# Patient Record
Sex: Female | Born: 1985 | Race: White | Hispanic: No | Marital: Married | Smoking: Never smoker
Health system: Southern US, Community
[De-identification: ages and names within clinical notes are randomized; demographics above are authoritative.]

## PROBLEM LIST (undated history)

## (undated) DIAGNOSIS — Z856 Personal history of leukemia: Secondary | ICD-10-CM

## (undated) DIAGNOSIS — Z8619 Personal history of other infectious and parasitic diseases: Secondary | ICD-10-CM

## (undated) HISTORY — DX: Personal history of leukemia: Z85.6

## (undated) HISTORY — DX: Personal history of other infectious and parasitic diseases: Z86.19

## (undated) HISTORY — PX: PORT-A-CATH REMOVAL: SHX5289

---

## 2006-05-06 HISTORY — PX: IUD REMOVAL: SHX5392

## 2006-11-18 ENCOUNTER — Inpatient Hospital Stay (HOSPITAL_COMMUNITY): Admission: AD | Admit: 2006-11-18 | Discharge: 2006-11-18 | Payer: Self-pay | Admitting: Obstetrics and Gynecology

## 2006-11-20 ENCOUNTER — Inpatient Hospital Stay (HOSPITAL_COMMUNITY): Admission: AD | Admit: 2006-11-20 | Discharge: 2006-11-20 | Payer: Self-pay | Admitting: Obstetrics and Gynecology

## 2006-11-29 ENCOUNTER — Inpatient Hospital Stay (HOSPITAL_COMMUNITY): Admission: AD | Admit: 2006-11-29 | Discharge: 2006-12-02 | Payer: Self-pay | Admitting: Obstetrics and Gynecology

## 2006-11-30 ENCOUNTER — Encounter (INDEPENDENT_AMBULATORY_CARE_PROVIDER_SITE_OTHER): Payer: Self-pay | Admitting: Obstetrics and Gynecology

## 2007-02-06 ENCOUNTER — Ambulatory Visit (HOSPITAL_COMMUNITY): Admission: RE | Admit: 2007-02-06 | Discharge: 2007-02-06 | Payer: Self-pay | Admitting: Obstetrics and Gynecology

## 2007-02-10 ENCOUNTER — Ambulatory Visit (HOSPITAL_COMMUNITY): Admission: RE | Admit: 2007-02-10 | Discharge: 2007-02-10 | Payer: Self-pay | Admitting: Obstetrics and Gynecology

## 2008-05-03 ENCOUNTER — Inpatient Hospital Stay (HOSPITAL_COMMUNITY): Admission: AD | Admit: 2008-05-03 | Discharge: 2008-05-06 | Payer: Self-pay | Admitting: Obstetrics & Gynecology

## 2010-09-18 NOTE — Discharge Summary (Signed)
NAMENAYELIS, BONITO NO.:  [id number]   MEDICAL RECORD NO.:  [id number]          PATIENT TYPE:  INP   LOCATION:  9113                          FACILITY:  [facility]   PHYSICIAN:  Huel Cote, M.D. DATE OF BIRTH:  July 07, 1985   DATE OF ADMISSION:  05/03/2008  DATE OF DISCHARGE:  05/06/2008                               DISCHARGE SUMMARY   DISCHARGE DIAGNOSES:  1. Term pregnancy at 38 plus weeks delivered.  2. Status post normal spontaneous vaginal delivery.   DISCHARGE MEDICATIONS:  1. Motrin 600 mg p.o. every 6 hours.  2. Percocet 1-2 tablets p.o. every 4 hours p.r.n.   DISCHARGE FOLLOWUP:  The patient is to follow up in the office in 6  weeks for her routine postpartum exam.   HOSPITAL COURSE:  The patient is a 25 year old G2, P1-0-0-0-1, who came  in in early labor on the night of May 03, 2008.  On admission, her  cervix was 4-5 cm and she was actively contracting.  Prenatal care had  been begun late in our office at approximately 24 weeks.  She was group  B strep positive and otherwise was relatively uncomplicated.   PRENATAL LABORATORIES:  As follows, O+, antibody negative, RPR  nonreactive, rubella immune, hepatitis B surface antigen negative, HIV  negative, GC negative, chlamydia negative, 1-hour Glucola 81, group B  strep positive.  She was too late for genetic screening.   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  She also had a past surgical history significant for 1995, she  had a Port-A-Cath insertion and removal in association with her  lymphoblastic anemia and in 2008, she had an IUD, which perforated her  uterus, which was removed and repaired at that time.   PAST OBSTETRICAL HISTORY:  In 2008, she had a vaginal delivery of a 7  pounds infant.   PAST GYNECOLOGICAL HISTORY:  History of HPV with a normal colposcopy.   ALLERGIES:  None.   MEDICATIONS:  Protonix for heartburn.   PHYSICAL  EXAMINATION:  On admission, she was afebrile with stable vital  signs.  As stated, her cervix was 4-5 cm and she was admitted and  managed with rupture of membranes performed after her ampicillin was on  board for group B strep status.  She progressed well and received an  epidural and reached complete dilation and delivered a living female  infant 7 pounds 5 ounces.  Apgars were 8 and 9.  Infant was slowly  delivered intact.  Uterus was normal.  Left labial laceration and right  labial skidmarks were hemostatic and did not require repair.  She was  therefore admitted for routine postpartum care.  On postpartum day #1,  she was doing well and pain was well controlled.  Her  hemoglobin was 10.5 down from 12.2.  Postpartum day #2, she continued to  do well.  She remained afebrile and her vaginal bleeding was normal,  therefore, she was felt stable for discharge home and was discharged on  Motrin and Percocet to follow up in the office in 6  weeks.      Huel Cote, M.D.  Electronically Signed     KR/MEDQ  D:  05/06/2008  T:  05/06/2008  Job:  [id number]

## 2010-09-18 NOTE — Op Note (Signed)
Rita Adkins, Rita Adkins NO.:  [id number]   MEDICAL RECORD NO.:  [id number]          PATIENT TYPE:  INP   LOCATION:  9101                          FACILITY:  [facility]   PHYSICIAN:  Randye Lobo, M.D.   DATE OF BIRTH:  1985/12/29   DATE OF PROCEDURE:  11/30/2006  DATE OF DISCHARGE:                               OPERATIVE REPORT   PREOPERATIVE DIAGNOSIS:  1. Intrauterine pregnancy at 38 + 6 weeks.  2. Moderate to severe fetal variable decelerations.  3. Positive group B strep status.   POSTOPERATIVE DIAGNOSIS:  1. Intrauterine gestation at 73 + 6 weeks.  2. Moderate to severe fetal variable decelerations.  3. Group B strep positive status.   PROCEDURE:  Vacuum-assisted vaginal delivery with midline episiotomy and  repair.   SURGEON:  Conley Simmonds, M.D.   ANESTHESIA:  Epidural, local with 1% lidocaine.   ESTIMATED BLOOD LOSS:  450 mL   COMPLICATIONS:  None.   INDICATIONS FOR PROCEDURE:  The patient is a 25 year old gravida 1, para  104 Caucasian female admitted on November 29, 2006 at 38 plus [redacted] weeks  gestation with contractions since 1600 that day.  The patient had had an  uneventful antepartum course and had a known positive group B strep  status.  I was called urgently from the maternity admissions area  regarding a 7-minute deceleration of the fetal heart rate which had  occurred shortly after the patient was on the monitor.  At the time I  was called the fetal heart rate deceleration had already recovered.  I  immediately proceeded to the [facility].  The patient did have an  IV and oxygen per face mask and the fetal heart rate had recovered with  10 x 10 accelerations of the heart rate.  The patient's cervical  examination was noted to be 5 cm when the deceleration occurred.  The  patient was escorted over to the labor and delivery suite.  After  arrival there and again confirmation of the reassuring fetal heart rate  tracing the membranes were then  ruptured and clear fluid was noted.  The  patient's cervix was 4-5 cm dilated, 90% effaced and with the vertex at  the -1 station at this time.  The fetal scalp electrode was placed.   The patient progressed throughout her active stage of labor with a  reactive fetal heart tracing.  She had a normal labor curve.  When she  had achieved complete dilation and began pushing, there were moderate to  deep variable decelerations of the fetal heart rate.  A recommendation  was therefore made at this time to proceed with a vacuum-assisted  vaginal delivery along with a possible midline episiotomy.  The patient  agreed.   FINDINGS:  A viable female was delivered and 6:09 a.m. with Apgars of 9  at 1 and 9 at 5 minutes.  No nuchal cord was appreciated but the  umbilical cord was noted to be short.  The newborn was vigorous after  delivery.  The placenta had a three-vessel cord.  There was a false knot  in the cord.  There was an absence of spiraling appreciated.   SPECIMEN:  The placenta was sent to pathology.   PROCEDURE:  The patient's Foley catheter had been previously removed.  The patient was examined and noted to have the fetal vertex at the +3  station in the left occiput anterior position.  The Mityvac was applied  to the fetal vertex and used over two contractions, but there was a poor  seal due to the fetal scalp electrode cord.  There were no pop offs  during this time.  The midline episiotomy was therefore performed after  local 1% lidocaine had been administered.  The Mityvac was used again  one final time with appropriate pressure and a viable female was  delivered.  The cord was doubly clamped and cut and the newborn was  noted to be vigorous and the infant was placed on the maternal abdomen  and chest.   A repair of the midline episiotomy was performed at this time with 2-0  Vicryl.  A small hymenal laceration was also repaired with a single  interrupted suture of 2-0 Vicryl.   The placenta was then spontaneously  delivered and was set aside for cord blood collection and donation.  It  was then sent to pathology.   The patient began to have moderate vaginal bleeding.  A manual  exploration was performed and there was no evidence of any products of  conception appreciated.  The patient did receive Pitocin 20 units IV and  also Methergine 0.2 mg IM.  The patient then had good uterine tone.   The cervix and vagina were noted to be intact.   This concluded the patient's procedure.  There were no complications.  All needle, instrument, sponge counts were correct.      Randye Lobo, M.D.  Electronically Signed     BES/MEDQ  D:  11/30/2006  T:  11/30/2006  Job:  [id number]

## 2010-09-18 NOTE — Op Note (Signed)
NAMEJOHNASIA, LIESE NO.:  [id number]   MEDICAL RECORD NO.:  [id number]          PATIENT TYPE:  AMB   LOCATION:  SDC                           [facility]:  [facility]   PHYSICIAN:  Randye Lobo, M.D.   DATE OF BIRTH:  09/17/1985   DATE OF PROCEDURE:  02/10/2007  DATE OF DISCHARGE:                               OPERATIVE REPORT   PREOPERATIVE DIAGNOSIS:  Intraperitoneal IUD.   POSTOPERATIVE DIAGNOSIS:  Intraperitoneal IUD.   PROCEDURE:  Laparoscopic retrieval of Merina IUD.   SURGEON:  Randye Lobo, M.D.   ASSISTANT:  Freddrick March. Tenny Craw, M.D.   ANESTHESIA:  General endotracheal, local with 0.25% Marcaine.   INTRAVENOUS FLUIDS:  1200 mL Ringer's lactate.   ESTIMATED BLOOD LOSS:  Minimal.   URINE OUTPUT:  75 mL.   COMPLICATIONS:  None.   INDICATIONS FOR PROCEDURE:  The patient is a 25 year old gravida 1, para  1, Caucasian female, status post normal spontaneous vaginal delivery of  a viable female on December 02, 2006, who had placement of a Merina IUD on  January 26, 2007, who presented for her routine annual exam with me  and, at that time, was noted to have no evidence of IUD strings coming  from the cervix into the vagina.  The patient had been reporting some  vaginal spotting and some very mild abdominal discomfort.  A  transvaginal ultrasound was performed which documented the absence of an  IUD in the endometrial canal.  An abdominal pelvic x-ray demonstrated an  IUD in the mid pelvis to the left of the midline.  The patient was given  a diagnosis of a migrated IUD and a plan was made to proceed with  laparoscopy and removal of the IUD after risks, benefits, and  alternatives were reviewed.   FINDINGS:  Examination under anesthesia revealed a small anteverted  mobile uterus.  No adnexal masses were appreciated.  At the time of  laparoscopy, there was evidence of what was a probable perforation of  the posterior uterine fundus in the midline just to the  right of the  midline.  The site was not bleeding.  There was evidence of a small  amount of bloody fluid in the posterior cul-de-sac.  The fallopian tubes  and ovaries appeared to be unremarkable.  The liver, gallbladder and  stomach organs were unremarkable.  The IUD was noted in the upper  abdomen and was adherent to omentum that did not contain any bowel.   SPECIMENS:  The IUD was removed in its entirely and it was disposed of.   OPERATIVE PROCEDURE:  The patient was reidentified in the preoperative  hold area.  The patient was noted to have a white blood cell count of  17,000.  The patient did receive Ancef 1 gram IV for antibiotic  prophylaxis.  In the operating room, general endotracheal anesthesia was  induced and the patient was placed in the dorsal lithotomy position.  The abdomen and vagina were sterilely prepped and draped.  The bladder  was catheterized of urine.   The laparoscopy procedure began with  a 1 cm umbilical incision which was  created sharply with a scalpel.  The incision was carried down to the  fascia using an Allis clamp.  A 10 mm trocar was inserted directly into  the peritoneal cavity without difficulty and the laparoscope confirmed  proper placement.  A pneumoperitoneum was achieved with CO2 gas and the  patient was then placed in the Trendelenburg position.  A 5 mm left and  right lower quadrant incisions were created sharply with a scalpel and 5  mm trocars were each placed under direct visualization of the  laparoscope.  A systematic inspection of the pelvis and abdomen were  performed.  The exam began first in the pelvis.  The Nezhat was used to  suction the bloody peritoneal fluid which was noted in the posterior cul-  de-sac.  The IUD was not visualized in the pelvis.  The exploration then  continued into the upper abdomen at which time the [facility] IUD was noted  to be entwined within a portion of omentum.  The IUD was easily released  from the  omentum and was removed in entirety through the left lower  quadrant trocar.  The omentum was then evaluated and there was a small  amount of oozing which was appreciated.  The omentum was completely  inspected to assure that no bowel was in this location before the  Kleppinger forceps was used to create hemostasis.   The procedure was concluded at this time.  The lower abdominal trocars  were removed under direct visualization of the laparoscope.  The CO2  pneumoperitoneum was released and the 10 mm laparoscope and the 10 mm  umbilical trocar were removed simultaneously.  All skin incisions were  closed with subcuticular sutures of 3-0 plain gut suture and Steri-  Strips were placed over the incisions after they were injected with  0.25% Marcaine.  The Hulka tenaculum was removed from the uterus.  Hemostasis was noted to be good.  This concluded the patient's  procedure.  There were no complications.  All needle, instrument, and  sponge counts were correct.  The patient is escorted to the recovery  room in stable and awake condition.      Randye Lobo, M.D.  Electronically Signed     BES/MEDQ  D:  02/10/2007  T:  02/10/2007  Job:  [id number]

## 2011-02-08 LAB — CBC
HCT: 31.6 % — ABNORMAL LOW (ref 36.0–46.0)
Hemoglobin: 10.5 g/dL — ABNORMAL LOW (ref 12.0–15.0)
Hemoglobin: 12.2 g/dL (ref 12.0–15.0)
Platelets: 255 10*3/uL (ref 150–400)
RDW: 14.6 % (ref 11.5–15.5)
WBC: 10.1 10*3/uL (ref 4.0–10.5)

## 2011-02-14 LAB — CBC
HCT: 43.5
Hemoglobin: 14.8
MCHC: 34.1
MCV: 87.1
RDW: 12.7
WBC: 5.1

## 2011-02-18 LAB — CBC
MCHC: 34.2
MCHC: 34.3
MCV: 89.1
Platelets: 262
RBC: 3.9
RDW: 13.1
RDW: 13.3

## 2011-02-18 LAB — URINALYSIS, ROUTINE W REFLEX MICROSCOPIC
Bilirubin Urine: NEGATIVE
Glucose, UA: NEGATIVE
Hgb urine dipstick: NEGATIVE
Ketones, ur: 40 — AB
Nitrite: NEGATIVE
Urobilinogen, UA: 1
pH: 6.5

## 2011-02-18 LAB — RPR: RPR Ser Ql: NONREACTIVE

## 2011-02-18 LAB — RAPID HIV SCREEN (WH-MAU): Rapid HIV Screen: NONREACTIVE

## 2011-02-18 LAB — URINE MICROSCOPIC-ADD ON: RBC / HPF: NONE SEEN

## 2012-09-15 ENCOUNTER — Ambulatory Visit (INDEPENDENT_AMBULATORY_CARE_PROVIDER_SITE_OTHER): Payer: BC Managed Care – PPO | Admitting: Family

## 2012-09-15 ENCOUNTER — Encounter: Payer: Self-pay | Admitting: Family

## 2012-09-15 VITALS — BP 118/80 | HR 85 | Temp 98.4°F | Resp 16 | Ht 65.5 in | Wt 155.1 lb

## 2012-09-15 DIAGNOSIS — C9101 Acute lymphoblastic leukemia, in remission: Secondary | ICD-10-CM | POA: Insufficient documentation

## 2012-09-15 DIAGNOSIS — R42 Dizziness and giddiness: Secondary | ICD-10-CM

## 2012-09-15 DIAGNOSIS — Z8619 Personal history of other infectious and parasitic diseases: Secondary | ICD-10-CM

## 2012-09-15 DIAGNOSIS — G43909 Migraine, unspecified, not intractable, without status migrainosus: Secondary | ICD-10-CM

## 2012-09-15 LAB — CBC WITH DIFFERENTIAL/PLATELET
Eosinophils Absolute: 0.1 10*3/uL (ref 0.0–0.7)
Lymphocytes Relative: 31 % (ref 12–46)
MCHC: 34.4 g/dL (ref 30.0–36.0)
Neutrophils Relative %: 62 % (ref 43–77)
RDW: 13.2 % (ref 11.5–15.5)
WBC: 7.4 10*3/uL (ref 4.0–10.5)

## 2012-09-15 MED ORDER — SUMATRIPTAN SUCCINATE 50 MG PO TABS: ORAL_TABLET | ORAL | Status: DC

## 2012-09-15 MED ORDER — METOPROLOL TARTRATE 25 MG PO TABS: 12.5000 mg | ORAL_TABLET | Freq: Two times a day (BID) | ORAL | Status: DC

## 2012-09-15 NOTE — Patient Instructions (Addendum)
Please complete lab work prior to leaving. You will be contacted about your referral to Hematology.  Please let us know if you have not heard back within 1 week about your referral. Please follow up in 1 month for a fasting physical. Welcome to [facility]!

## 2012-09-15 NOTE — Progress Notes (Signed)
Subjective:    Patient ID: Rita Adkins, female    DOB: August 07, 1985, 27 y.o.   MRN: [id number]  HPI  Rita Adkins is a 27 yr old female who presents today to establish care.   HA's- reports HA's 5x a week.  Associated with nausea.  Uses otc excedrin migraine which helps if she catches early enough. She reports pain can vary in location on her head.  Describes pain as throbbing.  Occasional phonophobia/phonophobia.  Reports + family hx of "really bad headaches." Has had HA's all her life.    ALL- diagnosed in 34 (age 60).  Went through 2.5 yrs of treatment.  She completed chemotherapy.  Treatments were received at [facility].   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  She had dizziness that whole day. Denies LOC, but "things got black." Denies bladder loss.    HPV- by pap smear 2005.  This was followed by biopsy and reports clear pap after that.  Last pap smear was in 2010.    Review of Systems  Constitutional: Negative for unexpected weight change.  HENT: Negative for hearing loss and congestion.   Eyes: Negative for visual disturbance.  Respiratory: Negative for cough and shortness of breath.   Cardiovascular: Negative for chest pain.  Gastrointestinal: Negative for vomiting, diarrhea and constipation.       Reports that she has sensitive stomach  Genitourinary: Negative for dysuria.  Musculoskeletal: Negative for myalgias.       Reports joint pain, ? Related to chemo. Knees shoulders, ankles  Skin: Negative for rash.  Neurological: Positive for dizziness and headaches.  Hematological: Negative for adenopathy.  Psychiatric/Behavioral:       Denies depression/anxiety   Past Medical History  Diagnosis Date  . 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 History of HPV infection     History   Social History  . Marital Status: Married    Spouse Name: N/A    Number of Children: 2  . Years of Education: N/A   Occupational History  .     Social History Main Topics  . Smoking  status: Never Smoker   . Smokeless tobacco: Never Used  . Alcohol Use: 1.0 oz/week    2 drink(s) per week  . Drug Use: Not on file  . Sexually Active: Not on file   Other Topics Concern  . Not on file   Social History Narrative   Married   Has a 4 yr old daughter and 90 yr old son   Works as Child psychotherapist   Enjoys Aeronautical engineer for Manufacturing systems engineer    Past Surgical History  Procedure Laterality Date  . Port-a-cath removal  1997 or 1999?  Marland Kitchen Iud removal  2008    "IUD penetrated the abdomen and had to be surgically removed"    Family History  Problem Relation Age of Onset  . COPD Mother   . Hypertension Father   . Hyperlipidemia Father   . Cancer Paternal Uncle     lung  . Diabetes Maternal Grandmother   . Cancer Paternal Grandfather     brain  . Heart disease Neg Hx   . Stroke Neg Hx     No Known Allergies  No current outpatient prescriptions on file prior to visit.   No current facility-administered medications on file prior to visit.    BP 118/80  Pulse 85  Temp(Src) 98.4 F (36.9 C) (Oral)  Resp 16  Ht 5' 5.5" (1.664 m)  Wt 155 lb 1.9 oz (70.362 kg)  BMI 25.41 kg/m2  SpO2 99%  LMP 09/12/2012       Objective:   Physical Exam  Constitutional: She is oriented to person, place, and time. She appears well-developed and well-nourished. No distress.  HENT:  Head: Normocephalic and atraumatic.  Cardiovascular: Normal rate and regular rhythm.   No murmur heard. Pulmonary/Chest: Effort normal and breath sounds normal. No respiratory distress. She has no wheezes. She has no rales. She exhibits no tenderness.  Lymphadenopathy:    She has no cervical adenopathy.  Neurological: She is alert and oriented to person, place, and time.  Skin: Skin is warm and dry.  Psychiatric: She has a normal mood and affect. Her behavior is normal. Judgment and thought content normal.  neuro: bilateral UE/LE strength 5/5         Assessment & Plan:

## 2012-09-15 NOTE — Assessment & Plan Note (Signed)
Obtain cbc, refer to hematology.

## 2012-09-15 NOTE — Assessment & Plan Note (Signed)
Trial of low dose beta blocker.  Add imitrex prn, recommended sparing use of excedrin migraine.

## 2012-09-16 ENCOUNTER — Encounter: Payer: Self-pay | Admitting: Family

## 2012-09-16 DIAGNOSIS — R42 Dizziness and giddiness: Secondary | ICD-10-CM | POA: Insufficient documentation

## 2012-09-16 DIAGNOSIS — Z8619 Personal history of other infectious and parasitic diseases: Secondary | ICD-10-CM | POA: Insufficient documentation

## 2012-09-16 NOTE — Assessment & Plan Note (Signed)
?   Related to migraine, ? Vertigo. Will see how she does with addition of beta blocker for migraine prophylaxis.

## 2012-09-16 NOTE — Assessment & Plan Note (Signed)
Plan pap next visit.

## 2012-09-17 ENCOUNTER — Telehealth: Payer: Self-pay | Admitting: Hematology & Oncology

## 2012-09-17 NOTE — Telephone Encounter (Signed)
Left pt message to call and schedule appointment °

## 2012-09-17 NOTE — Telephone Encounter (Signed)
Pt aware of 6-12 appointment could only come on tues or Thursday mornings

## 2012-10-14 ENCOUNTER — Ambulatory Visit: Payer: BC Managed Care – PPO

## 2012-10-14 ENCOUNTER — Ambulatory Visit (HOSPITAL_BASED_OUTPATIENT_CLINIC_OR_DEPARTMENT_OTHER): Payer: BC Managed Care – PPO | Admitting: Hematology & Oncology

## 2012-10-14 ENCOUNTER — Other Ambulatory Visit (HOSPITAL_BASED_OUTPATIENT_CLINIC_OR_DEPARTMENT_OTHER): Payer: BC Managed Care – PPO | Admitting: Lab

## 2012-10-14 VITALS — BP 103/62 | HR 88 | Temp 98.0°F | Resp 16 | Ht 65.0 in | Wt 155.0 lb

## 2012-10-14 DIAGNOSIS — C9101 Acute lymphoblastic leukemia, in remission: Secondary | ICD-10-CM

## 2012-10-14 DIAGNOSIS — T38805A Adverse effect of unspecified hormones and synthetic substitutes, initial encounter: Secondary | ICD-10-CM

## 2012-10-14 DIAGNOSIS — T50905S Adverse effect of unspecified drugs, medicaments and biological substances, sequela: Secondary | ICD-10-CM

## 2012-10-14 DIAGNOSIS — T451X5S Adverse effect of antineoplastic and immunosuppressive drugs, sequela: Secondary | ICD-10-CM

## 2012-10-14 LAB — COMPREHENSIVE METABOLIC PANEL
ALT: 10 U/L (ref 0–35)
AST: 16 U/L (ref 0–37)
Albumin: 4.6 g/dL (ref 3.5–5.2)
Alkaline Phosphatase: 57 U/L (ref 39–117)
BUN: 15 mg/dL (ref 6–23)
CO2: 27 mEq/L (ref 19–32)
Creatinine, Ser: 0.82 mg/dL (ref 0.50–1.10)
Glucose, Bld: 101 mg/dL — ABNORMAL HIGH (ref 70–99)
Total Protein: 6.5 g/dL (ref 6.0–8.3)

## 2012-10-14 LAB — CBC WITH DIFFERENTIAL (CANCER CENTER ONLY)
BASO#: 0 10*3/uL (ref 0.0–0.2)
BASO%: 0.1 % (ref 0.0–2.0)
EOS%: 1.1 % (ref 0.0–7.0)
MCV: 88 fL (ref 81–101)
MONO#: 0.5 10*3/uL (ref 0.1–0.9)
MONO%: 6.7 % (ref 0.0–13.0)
NEUT#: 3.9 10*3/uL (ref 1.5–6.5)
Platelets: 250 10*3/uL (ref 145–400)
RBC: 4.41 10*6/uL (ref 3.70–5.32)
RDW: 12.1 % (ref 11.1–15.7)
WBC: 7.2 10*3/uL (ref 3.9–10.0)

## 2012-10-14 LAB — CHCC SATELLITE - SMEAR

## 2012-10-14 NOTE — Progress Notes (Signed)
This office note has been dictated.

## 2012-10-15 ENCOUNTER — Ambulatory Visit: Payer: BC Managed Care – PPO

## 2012-10-15 ENCOUNTER — Telehealth: Payer: Self-pay | Admitting: Hematology & Oncology

## 2012-10-15 ENCOUNTER — Ambulatory Visit: Payer: BC Managed Care – PPO | Admitting: Hematology & Oncology

## 2012-10-15 ENCOUNTER — Other Ambulatory Visit: Payer: BC Managed Care – PPO | Admitting: Lab

## 2012-10-15 NOTE — Telephone Encounter (Signed)
Left pt message to call need to schedule appointment for around 6-26. I asked her to leave a message of day and time if I don't answer.

## 2012-10-15 NOTE — Progress Notes (Signed)
CC:   Sandford Craze, NP  DIAGNOSIS:  History of childhood ALL.  HISTORY OF PRESENT ILLNESS:  Rita Adkins is a very charming 27 year old white female.  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  She had 2-1/2 years of therapy.  She obviously has been cured.  However, she does not have any kind of oncology followup to make sure that there is no long-term delayed consequences of her chemotherapy.  She does have 2 kids.  Getting pregnant for her was no problem.  Her main problem now is a lot of arthralgias.  This certainly could be from the chemotherapy.  It appears that she actually does work two jobs. The patient has had no cough.  There has been no shortness of breath.  She has had no bleeding or bruising.  Again, she has regular monthly cycles.  She has had no rashes.  There has been no double vision or blurred vision.  There is no dysphagia or odynophagia.  Overall, her performance status is ECOG 0.  PAST MEDICAL HISTORY:  Remarkable: 1. ALL in clinical remission. 2. Migraines.  ALLERGIES:  None.  MEDICATIONS:  Metoprolol 12.5 mg p.o. b.i.d., Imitrex 50 mg as needed for migraine.  SOCIAL HISTORY:  Negative for tobacco or alcohol use.  There is no obvious occupational exposures.  FAMILY HISTORY:  Noncontributory.  REVIEW OF SYSTEMS:  As stated in history present illness.  Again, she does have the arthralgias which do bother her.  PHYSICAL EXAMINATION:  General:  This is a well-developed, well- nourished white female in no obvious distress.  Vital signs: Temperature of 98, pulse 88, respiratory rate 16, blood pressure 103/62. Weight is 155.  Head and neck:  Normocephalic, atraumatic skull.  There are no ocular or oral lesions.  There are no palpable cervical or supraclavicular lymph nodes.  Lungs:  Clear to percussion and auscultation bilaterally.  Cardiac:  Regular rate and rhythm with a normal S1 and S2.  There are no  murmurs, rubs or bruits.  Abdomen:  Soft with good bowel sounds.  There is no palpable abdominal mass.  There is no fluid wave.  There is no palpable hepatosplenomegaly.  Back:  No tenderness over the spine, ribs, or hips.  Extremities:  Show no clubbing, cyanosis or edema.  Skin:  No rashes, ecchymoses or petechia.  LABORATORY STUDIES:  White cell count 7.2, hemoglobin 13.2, hematocrit 38.9, platelet count 250.  Peripheral smear shows a normochromic, normocytic population of red blood cells.  There are no nucleated red blood cells.  There are no teardrop cells.  I see no target cells.  There is no rouleaux formation. White cells appear normal in morphology and maturation.  There are no atypical lymphocytes.  I see no blasts.  There are no immature myeloid cells.  There are no hypersegmented polys.  Platelets are adequate in number and size.  IMPRESSION:  Mr. Devora is a very charming 27 year old white female with a history of ALL. She was treated with standard chemotherapy.  She is cured from my point of view.  I think that the long-term complications that might be watched out for her would be with her bones.  She probably has some risk of osteoporosis.  I think a bone density test would be helpful and important for her.  I also think that she needs vitamin D.  I suspect that some of the arthralgias might be from vitamin D deficiency.  I will have her  take 2000 units a day of vitamin D.  I also think a chest x-ray would be helpful.  I think she is at risk for a second malignancy from her chemotherapy.  Given that she does not smoke, there is still an increased risk.  As such, a chest x-ray would be helpful.  Apparently, she does have a gynecologist so any kind of "female issue" is being addressed.  I do not think I would need to see Ms. Hanssen but yearly.  Again, I think that she is cured of her ALL. I could not imagine her having any kind of relapse after 14-15 years.  Again,  she would be at risk for a second malignancy.  I suppose that she could have a second type of bone marrow malignancy which we need to watch out for.  I do not believe that she needs a mammogram until she is 27 years old. She probably needs to have her cholesterol watched.  I am sure that Sandford Craze, NP will be able to handle that.  I had a really nice talk with Ms. Odor. I gave her a prayer blanket. She was very appreciative of this.    ______________________________ Josph Macho, M.D. PRE/MEDQ  D:  10/14/2012  T:  10/15/2012  Job:  [id number]

## 2012-10-16 ENCOUNTER — Encounter: Payer: BC Managed Care – PPO | Admitting: Family

## 2012-10-21 ENCOUNTER — Telehealth: Payer: Self-pay | Admitting: Hematology & Oncology

## 2012-10-21 NOTE — Telephone Encounter (Signed)
Pt had called wanting her appointments on mon or fri morning and to leave message on cell phone. I scheduled 10-30-12 CXR and bone density at [facility] for 915 am with Quel. I left message on pt's voice mail with [facility] call back number if she needs to change it.. And also changed the time of her 10-2013 appointment

## 2012-10-30 ENCOUNTER — Other Ambulatory Visit (HOSPITAL_COMMUNITY)
Admission: RE | Admit: 2012-10-30 | Discharge: 2012-10-30 | Disposition: A | Discharge status: Home / Self Care | Payer: BC Managed Care – PPO | Source: Ambulatory Visit | Attending: Family | Admitting: Family

## 2012-10-30 ENCOUNTER — Ambulatory Visit (INDEPENDENT_AMBULATORY_CARE_PROVIDER_SITE_OTHER): Payer: BC Managed Care – PPO | Admitting: Family

## 2012-10-30 ENCOUNTER — Ambulatory Visit (HOSPITAL_COMMUNITY)
Admission: RE | Admit: 2012-10-30 | Discharge: 2012-10-30 | Disposition: A | Discharge status: Home / Self Care | Payer: BC Managed Care – PPO | Source: Ambulatory Visit | Attending: Hematology & Oncology | Admitting: Hematology & Oncology

## 2012-10-30 ENCOUNTER — Ambulatory Visit (HOSPITAL_COMMUNITY): Payer: BC Managed Care – PPO

## 2012-10-30 ENCOUNTER — Encounter: Payer: Self-pay | Admitting: Family

## 2012-10-30 ENCOUNTER — Other Ambulatory Visit (HOSPITAL_COMMUNITY): Payer: BC Managed Care – PPO

## 2012-10-30 VITALS — BP 100/80 | HR 77 | Temp 97.9°F | Resp 16 | Ht 65.5 in | Wt 153.0 lb

## 2012-10-30 DIAGNOSIS — Z0000 Encounter for general adult medical examination without abnormal findings: Secondary | ICD-10-CM | POA: Insufficient documentation

## 2012-10-30 DIAGNOSIS — T451X5S Adverse effect of antineoplastic and immunosuppressive drugs, sequela: Secondary | ICD-10-CM

## 2012-10-30 DIAGNOSIS — Z01419 Encounter for gynecological examination (general) (routine) without abnormal findings: Secondary | ICD-10-CM

## 2012-10-30 DIAGNOSIS — Z23 Encounter for immunization: Secondary | ICD-10-CM

## 2012-10-30 DIAGNOSIS — G43909 Migraine, unspecified, not intractable, without status migrainosus: Secondary | ICD-10-CM

## 2012-10-30 DIAGNOSIS — Z309 Encounter for contraceptive management, unspecified: Secondary | ICD-10-CM

## 2012-10-30 DIAGNOSIS — T50905S Adverse effect of unspecified drugs, medicaments and biological substances, sequela: Secondary | ICD-10-CM | POA: Insufficient documentation

## 2012-10-30 DIAGNOSIS — Z13820 Encounter for screening for osteoporosis: Secondary | ICD-10-CM | POA: Insufficient documentation

## 2012-10-30 DIAGNOSIS — Z856 Personal history of leukemia: Secondary | ICD-10-CM | POA: Insufficient documentation

## 2012-10-30 LAB — URINALYSIS, ROUTINE W REFLEX MICROSCOPIC
Bilirubin Urine: NEGATIVE
Glucose, UA: NEGATIVE mg/dL
Hgb urine dipstick: NEGATIVE
Leukocytes, UA: NEGATIVE
Nitrite: NEGATIVE
Specific Gravity, Urine: 1.011 (ref 1.005–1.030)
Urobilinogen, UA: 0.2 mg/dL (ref 0.0–1.0)
pH: 8 (ref 5.0–8.0)

## 2012-10-30 MED ORDER — LEVONORGEST-ETH ESTRAD 91-DAY 0.15-0.03 &0.01 MG PO TABS: 1.0000 | ORAL_TABLET | Freq: Every day | ORAL | Status: DC

## 2012-10-30 NOTE — Addendum Note (Signed)
Addended by: Mervin Kung A on: 10/30/2012 11:05 AM   Modules accepted: Orders

## 2012-10-30 NOTE — Assessment & Plan Note (Signed)
Continue healthy diet, add regular exercise.  Tdap today. Requests rx for OCP. Obtain urine pregnancy test.

## 2012-10-30 NOTE — Progress Notes (Signed)
Subjective:    Patient ID: Rita Adkins, female    DOB: 06-18-1985, 27 y.o.   MRN: [id number]  HPI  Patient presents today for complete physical.  Immunizations: unsure of last tetanus Diet: reports healthy diet Exercise: Not exercising due to working 2 jobs. Pap Smear: 2010- repors hx of abnormal pap in 2007 (HPV- had biopsy) Follow up pap normal.    Review of Systems  Constitutional: Negative for unexpected weight change.  HENT: Negative for hearing loss.        Reports getting over a cold last week  Eyes: Negative for visual disturbance.  Respiratory: Negative for shortness of breath.   Cardiovascular: Negative for chest pain.  Gastrointestinal: Negative for nausea, vomiting and diarrhea.  Genitourinary: Negative for dysuria and menstrual problem.  Musculoskeletal: Negative for myalgias and arthralgias.  Skin: Negative for rash.  Neurological:       Headaches are improved since starting metoprolol  Hematological: Negative for adenopathy.  Psychiatric/Behavioral:       Denies anxiety depression   Past Medical History  Diagnosis Date  . 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 History of HPV infection     History   Social History  . Marital Status: Married    Spouse Name: N/A    Number of Children: 2  . Years of Education: N/A   Occupational History  .     Social History Main Topics  . Smoking status: Never Smoker   . Smokeless tobacco: Never Used  . Alcohol Use: 1.0 oz/week    2 drink(s) per week  . Drug Use: Not on file  . Sexually Active: Not on file   Other Topics Concern  . Not on file   Social History Narrative   Married   Has a 29 yr old daughter and 68 yr old son   Works as Child psychotherapist   Enjoys Aeronautical engineer for Manufacturing systems engineer    Past Surgical History  Procedure Laterality Date  . Port-a-cath removal  1997 or 1999?  Marland Kitchen Iud removal  2008    "IUD penetrated the abdomen and had to be surgically  removed"    Family History  Problem Relation Age of Onset  . COPD Mother   . Hypertension Father   . Hyperlipidemia Father   . Cancer Paternal Uncle     lung  . Diabetes Maternal Grandmother   . Cancer Paternal Grandfather     brain  . Heart disease Neg Hx   . Stroke Neg Hx     No Known Allergies  Current Outpatient Prescriptions on File Prior to Visit  Medication Sig Dispense Refill  . aspirin-acetaminophen-caffeine (EXCEDRIN MIGRAINE) 250-250-65 MG per tablet Takes 1 tablet every 2 days as needed for headache.      . metoprolol tartrate (LOPRESSOR) 25 MG tablet Take 0.5 tablets (12.5 mg total) by mouth 2 (two) times daily.  30 tablet  1  . SUMAtriptan (IMITREX) 50 MG tablet One tab at start of migraine.  May repeat in 2 hrs as needed.  Max 2 tabs/24 hrs.  10 tablet  2   No current facility-administered medications on file prior to visit.    BP 100/80  Pulse 77  Temp(Src) 97.9 F (36.6 C) (Oral)  Resp 16  Ht 5' 5.5" (1.664 m)  Wt 153 lb (69.4 kg)  BMI 25.06 kg/m2  SpO2 99%  LMP 10/09/2012  Objective:   Physical Exam  Physical Exam  Constitutional: She is oriented to person, place, and time. She appears well-developed and well-nourished. No distress.  HENT:  Head: Normocephalic and atraumatic.  Right Ear: Tympanic membrane and ear canal normal.  Left Ear: Tympanic membrane and ear canal normal.  Mouth/Throat: Oropharynx is clear and moist.  Eyes: Pupils are equal, round, and reactive to light. No scleral icterus.  Neck: Normal range of motion. No thyromegaly present.  Cardiovascular: Normal rate and regular rhythm.   No murmur heard. Pulmonary/Chest: Effort normal and breath sounds normal. No respiratory distress. He has no wheezes. She has no rales. She exhibits no tenderness.  Abdominal: Soft. Bowel sounds are normal. He exhibits no distension and no mass. There is no tenderness. There is no rebound and no guarding.  Musculoskeletal: She exhibits no  edema.  Lymphadenopathy:    She has no cervical adenopathy.  Neurological: She is alert and oriented to person, place, and time. She has normal reflexes. She exhibits normal muscle tone. Coordination normal.  Skin: Skin is warm and dry.  Psychiatric: She has a normal mood and affect. Her behavior is normal. Judgment and thought content normal.  Breasts: Examined lying Right: Without masses, retractions, discharge or axillary adenopathy.  Left: Without masses, retractions, discharge or axillary adenopathy.  Inguinal/mons: Normal without inguinal adenopathy  External genitalia: Normal  BUS/Urethra/Skene's glands: Normal  Bladder: Normal  Vagina: Normal  Cervix: Normal  Uterus: normal in size, shape and contour. Midline and mobile  Adnexa/parametria:  Rt: Without masses or tenderness.  Lt: Without masses or tenderness.  Anus and perineum: Normal           Assessment & Plan:        Assessment & Plan:  Pap performed today with chaperone

## 2012-10-30 NOTE — Patient Instructions (Addendum)
Please complete lab work prior to leaving. Follow up in 6 months, sooner if problems/concerns.  

## 2012-10-30 NOTE — Assessment & Plan Note (Signed)
Improved on metoprolol, bp and HR stable, continue same.

## 2012-10-30 NOTE — Addendum Note (Signed)
Addended by: Mervin Kung A on: 10/30/2012 11:09 AM   Modules accepted: Orders

## 2012-10-31 LAB — LIPID PANEL
LDL Cholesterol: 91 mg/dL (ref 0–99)
Total CHOL/HDL Ratio: 3.9 Ratio

## 2012-11-02 ENCOUNTER — Encounter: Payer: Self-pay | Admitting: Family

## 2012-11-03 ENCOUNTER — Telehealth: Payer: Self-pay | Admitting: *Deleted

## 2012-11-03 NOTE — Telephone Encounter (Signed)
Message copied by Anselm Jungling on Tue Nov 03, 2012 11:25 AM ------      Message from: Josph Macho      Created: Sat Oct 31, 2012 12:18 PM       Call - cxr is ok!!  Cindee Lame ------

## 2012-11-03 NOTE — Telephone Encounter (Signed)
Called patient and left message on personal cell phone to let her know that her chest x ray was good per d.r ennever

## 2012-11-16 ENCOUNTER — Other Ambulatory Visit: Payer: Self-pay | Admitting: *Deleted

## 2012-11-16 NOTE — Telephone Encounter (Signed)
Received fax from pharmacy reporting PA needed for Sumitriptan via [employer] @ [phone]/SLS Will call to initiate.

## 2012-11-27 NOTE — Telephone Encounter (Signed)
Informed via [employer] that patient is "not covered under [employer] Rx"; have to initiate via [employer] @ [phone]/SLS

## 2013-01-18 ENCOUNTER — Telehealth: Payer: Self-pay | Admitting: *Deleted

## 2013-01-18 DIAGNOSIS — Z0000 Encounter for general adult medical examination without abnormal findings: Secondary | ICD-10-CM

## 2013-01-18 MED ORDER — SUMATRIPTAN SUCCINATE 50 MG PO TABS: ORAL_TABLET | ORAL | Status: DC

## 2013-01-18 MED ORDER — LEVONORGEST-ETH ESTRAD 91-DAY 0.15-0.03 &0.01 MG PO TABS: 1.0000 | ORAL_TABLET | Freq: Every day | ORAL | Status: DC

## 2013-01-18 MED ORDER — METOPROLOL TARTRATE 25 MG PO TABS: 12.5000 mg | ORAL_TABLET | Freq: Two times a day (BID) | ORAL | Status: DC

## 2013-01-18 NOTE — Telephone Encounter (Signed)
Received message from pt requesting refills of sumatriptan, beta blocker and birth control.  Refills sent. Left detailed message on home# re: completion.

## 2013-04-03 ENCOUNTER — Other Ambulatory Visit: Payer: Self-pay | Admitting: Family

## 2013-04-05 NOTE — Telephone Encounter (Signed)
Refill sent per [facility] refill protocol/SLS Last [2] BP: 118/80 & 100/80, next OV 12.15.14

## 2013-04-19 ENCOUNTER — Ambulatory Visit: Payer: BC Managed Care – PPO | Admitting: Family

## 2013-04-19 DIAGNOSIS — Z0289 Encounter for other administrative examinations: Secondary | ICD-10-CM

## 2013-07-28 ENCOUNTER — Other Ambulatory Visit: Payer: Self-pay | Admitting: Family

## 2013-07-28 NOTE — Telephone Encounter (Signed)
Refill sent for sumatriptan.  Pt was due for 6 month follow up in 04/2013 and cancelled appt.  Please call pt to arrange follow up.

## 2013-10-12 ENCOUNTER — Encounter: Payer: Self-pay | Admitting: *Deleted

## 2013-10-12 ENCOUNTER — Encounter: Payer: Self-pay | Admitting: Nurse Practitioner

## 2013-10-12 ENCOUNTER — Ambulatory Visit (INDEPENDENT_AMBULATORY_CARE_PROVIDER_SITE_OTHER): Payer: BC Managed Care – PPO | Admitting: Nurse Practitioner

## 2013-10-12 VITALS — BP 121/81 | HR 89 | Temp 98.5°F | Ht 65.5 in | Wt 162.0 lb

## 2013-10-12 DIAGNOSIS — S335XXA Sprain of ligaments of lumbar spine, initial encounter: Secondary | ICD-10-CM

## 2013-10-12 DIAGNOSIS — S39012A Strain of muscle, fascia and tendon of lower back, initial encounter: Secondary | ICD-10-CM

## 2013-10-12 MED ORDER — PREDNISONE 10 MG PO TABS: ORAL_TABLET | ORAL | Status: DC

## 2013-10-12 MED ORDER — KETOROLAC TROMETHAMINE 30 MG/ML IJ SOLN
30.0000 mg | Freq: Once | INTRAMUSCULAR | Status: AC
  Administered 2013-10-12: 30 mg via INTRAMUSCULAR

## 2013-10-12 NOTE — Patient Instructions (Signed)
Start prednisone tomorrow-take in the morning so it does not keep you awake. Start stretches twice daily-should take about 10 minutes each time. Follow up in 2 weeks. Use foot stool at work with prolonged standing.  Back Exercises Back exercises help treat and prevent back injuries. The goal of back exercises is to increase the strength of your abdominal and back muscles and the flexibility of your back. These exercises should be started when you no longer have back pain. Back exercises include:  Pelvic Tilt. Lie on your back with your knees bent. Tilt your pelvis until the lower part of your back is against the floor. Hold this position 5 to 10 sec and repeat 5 to 10 times.  Knee to Chest. Pull first 1 knee up against your chest and hold for 20 to 30 seconds, repeat this with the other knee, and then both knees. This may be done with the other leg straight or bent, whichever feels better.  Sit-Ups or Curl-Ups. Bend your knees 90 degrees. Start with tilting your pelvis, and do a partial, slow sit-up, lifting your trunk only 30 to 45 degrees off the floor. Take at least 2 to 3 seconds for each sit-up. Do not do sit-ups with your knees out straight. If partial sit-ups are difficult, simply do the above but with only tightening your abdominal muscles and holding it as directed.  Hip-Lift. Lie on your back with your knees flexed 90 degrees. Push down with your feet and shoulders as you raise your hips a couple inches off the floor; hold for 10 seconds, repeat 5 to 10 times.  Shoulder-Lifts. Lie face down with arms beside your body. Keep hips and torso pressed to floor as you slowly lift your head and shoulders off the floor. Do not overdo your exercises, especially in the beginning. Exercises may cause you some mild back discomfort which lasts for a few minutes; however, if the pain is more severe, or lasts for more than 15 minutes, do not continue exercises until you see your caregiver. Improvement with  exercise therapy for back problems is slow.  See your caregivers for assistance with developing a proper back exercise program. Document Released: 05/30/2004 Document Revised: 07/15/2011 Document Reviewed: 02/21/2011 [facility] Patient Information 2014 [place].

## 2013-10-12 NOTE — Progress Notes (Signed)
Pre visit review using our clinic review tool, if applicable. No additional management support is needed unless otherwise documented below in the visit note. 

## 2013-10-13 ENCOUNTER — Other Ambulatory Visit: Payer: BC Managed Care – PPO | Admitting: Lab

## 2013-10-13 ENCOUNTER — Ambulatory Visit: Payer: BC Managed Care – PPO | Admitting: Hematology & Oncology

## 2013-10-14 ENCOUNTER — Other Ambulatory Visit: Payer: Self-pay | Admitting: Family

## 2013-10-14 DIAGNOSIS — S39012A Strain of muscle, fascia and tendon of lower back, initial encounter: Secondary | ICD-10-CM | POA: Insufficient documentation

## 2013-10-14 NOTE — Progress Notes (Signed)
   Subjective:    Patient ID: Rita Adkins, female    DOB: 1985/05/20, 28 y.o.   MRN: [id number]  Back Pain This is a recurrent (has had back pain in past, but not as intense & persistent as this pain) problem. The current episode started in the past 7 days. The problem occurs constantly. The problem has been gradually worsening since onset. The pain is present in the sacro-iliac. The quality of the pain is described as aching and shooting. The pain radiates to the right thigh. The pain is moderate. The pain is the same all the time. The symptoms are aggravated by standing and lying down. Associated symptoms include leg pain and pelvic pain (R buttock). Pertinent negatives include no abdominal pain, bladder incontinence, bowel incontinence, chest pain, dysuria, fever, headaches, numbness, paresis, perianal numbness, tingling or weakness. Risk factors include history of cancer (ALL). Treatments tried: rest & stretches. The treatment provided no relief.      Review of Systems  Constitutional: Negative for fever, chills, activity change, appetite change and fatigue.  HENT: Negative for congestion.   Respiratory: Negative for cough.   Cardiovascular: Negative for chest pain.  Gastrointestinal: Negative for abdominal pain and bowel incontinence.  Genitourinary: Positive for pelvic pain (R buttock). Negative for bladder incontinence and dysuria.  Musculoskeletal: Positive for back pain.  Neurological: Negative for tingling, weakness, numbness and headaches.       Objective:   Physical Exam  Vitals reviewed. Constitutional: She is oriented to person, place, and time. She appears well-developed and well-nourished. No distress.  HENT:  Head: Normocephalic and atraumatic.  Eyes: Conjunctivae are normal. Right eye exhibits no discharge. Left eye exhibits no discharge.  Cardiovascular: Normal rate.   Pulmonary/Chest: Effort normal. No respiratory distress.  Musculoskeletal: She exhibits no  tenderness.  FROM in R & L hip. Good ROM in back-forward flexion limited-unable to reach past knees. Neg lying & sitting straight leg raise.    Neurological: She is alert and oriented to person, place, and time.  Skin: Skin is warm and dry.  Psychiatric: She has a normal mood and affect. Her behavior is normal. Thought content normal.          Assessment & Plan:  1. Low back strain Daily stretches - ketorolac (TORADOL) 30 MG/ML injection 30 mg; Inject 1 mL (30 mg total) into the muscle once. - predniSONE (DELTASONE) 10 MG tablet; Starting 10/13/13, Take 4Tpo qam X 2d, then 3T po qam X 3d, then 2T po qd X 3d, then 1T po qam X 3d.  Dispense: 26 tablet; Refill: 0  If no improvement will refer to ortho. See pt instructions.

## 2013-10-15 ENCOUNTER — Telehealth: Payer: Self-pay | Admitting: Hematology & Oncology

## 2013-10-15 ENCOUNTER — Ambulatory Visit (HOSPITAL_BASED_OUTPATIENT_CLINIC_OR_DEPARTMENT_OTHER): Payer: BC Managed Care – PPO | Admitting: Hematology & Oncology

## 2013-10-15 ENCOUNTER — Ambulatory Visit (HOSPITAL_BASED_OUTPATIENT_CLINIC_OR_DEPARTMENT_OTHER): Payer: BC Managed Care – PPO | Admitting: Lab

## 2013-10-15 DIAGNOSIS — M810 Age-related osteoporosis without current pathological fracture: Secondary | ICD-10-CM

## 2013-10-15 DIAGNOSIS — C9101 Acute lymphoblastic leukemia, in remission: Secondary | ICD-10-CM

## 2013-10-15 LAB — CHCC SATELLITE - SMEAR

## 2013-10-15 LAB — CBC WITH DIFFERENTIAL (CANCER CENTER ONLY)
BASO#: 0 10*3/uL (ref 0.0–0.2)
BASO%: 0.1 % (ref 0.0–2.0)
EOS%: 0.1 % (ref 0.0–7.0)
Eosinophils Absolute: 0 10*3/uL (ref 0.0–0.5)
HCT: 39.9 % (ref 34.8–46.6)
HEMOGLOBIN: 13.5 g/dL (ref 11.6–15.9)
LYMPH#: 3.2 10*3/uL (ref 0.9–3.3)
LYMPH%: 29.2 % (ref 14.0–48.0)
MCH: 30.1 pg (ref 26.0–34.0)
MCHC: 33.8 g/dL (ref 32.0–36.0)
MCV: 89 fL (ref 81–101)
MONO#: 0.6 10*3/uL (ref 0.1–0.9)
MONO%: 5.8 % (ref 0.0–13.0)
NEUT%: 64.8 % (ref 39.6–80.0)
NEUTROS ABS: 7 10*3/uL — AB (ref 1.5–6.5)
Platelets: 284 10*3/uL (ref 145–400)
RBC: 4.48 10*6/uL (ref 3.70–5.32)
RDW: 12.3 % (ref 11.1–15.7)
WBC: 10.8 10*3/uL — ABNORMAL HIGH (ref 3.9–10.0)

## 2013-10-15 LAB — CMP (CANCER CENTER ONLY)
ALBUMIN: 3.2 g/dL — AB (ref 3.3–5.5)
ALK PHOS: 37 U/L (ref 26–84)
ALT: 31 U/L (ref 10–47)
AST: 23 U/L (ref 11–38)
BILIRUBIN TOTAL: 0.5 mg/dL (ref 0.20–1.60)
BUN, Bld: 11 mg/dL (ref 7–22)
CO2: 27 mEq/L (ref 18–33)
Calcium: 8.8 mg/dL (ref 8.0–10.3)
Chloride: 105 mEq/L (ref 98–108)
Creat: 0.7 mg/dl (ref 0.6–1.2)
GLUCOSE: 110 mg/dL (ref 73–118)
POTASSIUM: 3.8 meq/L (ref 3.3–4.7)
SODIUM: 139 meq/L (ref 128–145)
TOTAL PROTEIN: 6.9 g/dL (ref 6.4–8.1)

## 2013-10-15 NOTE — Telephone Encounter (Signed)
Left detailed message informing patient of medication refill and to call our office to schedule appointment °

## 2013-10-15 NOTE — Telephone Encounter (Signed)
Appointment scheduled for 11/01/13

## 2013-10-15 NOTE — Telephone Encounter (Signed)
Mailed 10-2014 schedule

## 2013-10-15 NOTE — Telephone Encounter (Signed)
2 week supply of metoprolol sent to pt's pharmacy. Pt was last seen in 10/2012 and advised f/u in December. She is 6 months past due for follow up. Please call pt to arrange appt before further refills are needed.

## 2013-10-16 LAB — LIPID PANEL
CHOLESTEROL: 182 mg/dL (ref 0–200)
HDL: 48 mg/dL (ref >39)
LDL CALC: 116 mg/dL — AB (ref 0–99)
Total CHOL/HDL Ratio: 3.8 Ratio
Triglycerides: 92 mg/dL (ref <150)
VLDL: 18 mg/dL (ref 0–40)

## 2013-10-16 LAB — VITAMIN D 25 HYDROXY (VIT D DEFICIENCY, FRACTURES): VIT D 25 HYDROXY: 62 ng/mL (ref 30–89)

## 2013-10-16 NOTE — Progress Notes (Signed)
Hematology and Oncology Follow Up Visit  TYRESA PRINDIVILLE [id number] 12-19-1985 28 y.o. 10/16/2013   Principle Diagnosis:   History of childhood acute lymphoblastic leukemia  Current Therapy:    Observation     Interim History:  Ms.  Shedrick is is back for her second office visit here saw her a year ago. She had  ALL her back which shows 28 years old. She was treated and has been in remission.  She's had no problems since we saw her a year ago. She is working. She is having some migraines. These are chronic. She's had no problems rashes patient had no cough. She's had no change in bowel or bladder habits.   She had a bone density test. I had suspected this was normal.  She had a chest x-ray which also was normal.  Her appetite has been good. She's had no problems with rashes present no nausea vomiting.  Medications: Current outpatient prescriptions:aspirin-acetaminophen-caffeine (EXCEDRIN MIGRAINE) 250-250-65 MG per tablet, Takes 1 tablet every 2 days as needed for headache., Disp: , Rfl: ;  Fish Oil-Cholecalciferol (FISH OIL + D3 PO), Take by mouth every morning., Disp: , Rfl: ;  Ginkgo Biloba 100 MG CAPS, Take by mouth every morning., Disp: , Rfl:  Levonorgestrel-Ethinyl Estradiol (AMETHIA,CAMRESE) 0.15-0.03 &0.01 MG tablet, Take 1 tablet by mouth daily., Disp: 1 Package, Rfl: 3;  metoprolol tartrate (LOPRESSOR) 25 MG tablet, TAKE 1/2 TABLET BY MOUTH TWICE DAILY, Disp: 14 tablet, Rfl: 0;  predniSONE (DELTASONE) 10 MG tablet, Starting 10/13/13, Take 4Tpo qam X 2d, then 3T po qam X 3d, then 2T po qd X 3d, then 1T po qam X 3d., Disp: 26 tablet, Rfl: 0 SUMAtriptan (IMITREX) 50 MG tablet, TAKE 1 TABLET BY MOUTH AT THE START OF MIGRAINE. MAY REPEAT IN 2 HOURS AS NEEDED. MAXIMUM OF 2 TABLETS PER 24 HOURS, Disp: 10 tablet, Rfl: 0;  Vitamin D, Cholecalciferol, 1000 UNITS TABS, Take by mouth every morning., Disp: , Rfl:   Allergies: No Known Allergies  Past Medical History, Surgical history, Social  history, and Family History were reviewed and updated.  Review of Systems: As above  Physical Exam:  vitals were not taken for this visit.  Well-developed and well-nourished white female. Vital signs are temperature of 98.2. Pulse 70. Blood pressure 120 or 76. Weight is 163 pounds. Head and neck exam shows no ocular or oral lesions. There are no palpable cervical or supraclavicular lymph nodes. Lungs are clear. Cardiac exam regular rate and rhythm with no murmurs rubs or bruits. Abdomen is soft. Has good bowel sounds. No fluid wave. There is no palpable liver or spleen tip. Back exam no tenderness over the spine ribs or hips. Extremities shows no clubbing cyanosis or edema. Skin exam no rashes. Neurological exam is nonfocal.  Lab Results  Component Value Date   WBC 10.8* 10/15/2013   HGB 13.5 10/15/2013   HCT 39.9 10/15/2013   MCV 89 10/15/2013   PLT 284 10/15/2013     Chemistry      Component Value Date/Time   NA 139 10/15/2013 1010   NA 140 10/14/2012 1519   K 3.8 10/15/2013 1010   K 3.7 10/14/2012 1519   CL 105 10/15/2013 1010   CL 104 10/14/2012 1519   CO2 27 10/15/2013 1010   CO2 27 10/14/2012 1519   BUN 11 10/15/2013 1010   BUN 15 10/14/2012 1519   CREATININE 0.7 10/15/2013 1010   CREATININE 0.82 10/14/2012 1519      Component Value  Date/Time   CALCIUM 8.8 10/15/2013 1010   CALCIUM 9.3 10/14/2012 1519   ALKPHOS 37 10/15/2013 1010   ALKPHOS 57 10/14/2012 1519   AST 23 10/15/2013 1010   AST 16 10/14/2012 1519   ALT 31 10/15/2013 1010   ALT 10 10/14/2012 1519   BILITOT 0.50 10/15/2013 1010   BILITOT 0.5 10/14/2012 1519         Impression and Plan: Ms. Merkley is 28 year old female with a history of childhood lymphoblastic leukemia. This obviously is cured. I don't see any complications so far for her treatments.  She would be at risk for second malignancies. After she palliative radiation therapy also with the chemotherapy. With the chemotherapy, he probably would be at some risk for a  secondary malignancy, likely leukemia or myelodysplasia.  Her vitamin D is 62. Her lipid panel shows a ratio of 3.8.  I can't think of anything also have to do with her.  We will go and plan to get her back for one more year.  Volanda Napoleon, MD 6/13/201511:50 AM

## 2013-10-18 ENCOUNTER — Telehealth: Payer: Self-pay | Admitting: *Deleted

## 2013-10-18 NOTE — Telephone Encounter (Addendum)
Message copied by Lenn Sink on Mon Oct 18, 2013  4:59 PM ------      Message from: Volanda Napoleon      Created: Fri Oct 15, 2013  6:55 PM       Call - labs look ok!!  The cholesterol is up, but your good cholesterol is higher!!  Keep watching what you eat!!! Laurey Arrow ------Informed pt that labs look ok!!  The cholesterol is up, but your good cholesterol is higher!!  Keep watching what you eat

## 2013-11-01 ENCOUNTER — Encounter: Payer: Self-pay | Admitting: Family

## 2013-11-01 ENCOUNTER — Ambulatory Visit (INDEPENDENT_AMBULATORY_CARE_PROVIDER_SITE_OTHER): Payer: BC Managed Care – PPO | Admitting: Family

## 2013-11-01 VITALS — BP 120/76 | HR 95 | Temp 98.2°F | Resp 16 | Ht 65.5 in | Wt 163.0 lb

## 2013-11-01 DIAGNOSIS — Z5189 Encounter for other specified aftercare: Secondary | ICD-10-CM

## 2013-11-01 DIAGNOSIS — Z0000 Encounter for general adult medical examination without abnormal findings: Secondary | ICD-10-CM

## 2013-11-01 DIAGNOSIS — S39012D Strain of muscle, fascia and tendon of lower back, subsequent encounter: Secondary | ICD-10-CM

## 2013-11-01 DIAGNOSIS — S335XXA Sprain of ligaments of lumbar spine, initial encounter: Secondary | ICD-10-CM

## 2013-11-01 DIAGNOSIS — C9101 Acute lymphoblastic leukemia, in remission: Secondary | ICD-10-CM

## 2013-11-01 DIAGNOSIS — G43909 Migraine, unspecified, not intractable, without status migrainosus: Secondary | ICD-10-CM

## 2013-11-01 MED ORDER — LEVONORGEST-ETH ESTRAD 91-DAY 0.15-0.03 &0.01 MG PO TABS: 1.0000 | ORAL_TABLET | Freq: Every day | ORAL | Status: DC

## 2013-11-01 MED ORDER — SUMATRIPTAN SUCCINATE 50 MG PO TABS
ORAL_TABLET | ORAL | Status: DC
Start: 2013-11-01 — End: 2014-10-14

## 2013-11-01 MED ORDER — METOPROLOL TARTRATE 25 MG PO TABS: ORAL_TABLET | ORAL | Status: DC

## 2013-11-01 NOTE — Assessment & Plan Note (Addendum)
Migraines are much less frequent on the metoprolol. She has about 1 per month. Continue  Metoprolol and as needed imitrex/excedrin migraine. FU with CPE this summer.

## 2013-11-01 NOTE — Progress Notes (Signed)
Subjective:    Patient ID: Rita Adkins, female    DOB: 1985/07/07, 28 y.o.   MRN: [id number]  HPI Rita Adkins is a 28 yo female here for fu for migraines. She was started on low dose beta blocker for migraine prophylaxis May of 2014. She takes imitrex as needed for migraines. States she is having approximately one migraine per month which is greatly improved. States imitrex does not seem to help as much as the excedrin migraine. She often does not have the imitrex with her when she experiences a migraine. When she does take an imitrex, she often ends up taking an excedrin migraine because her pain is not relieved.  Weight - she states that her weight fluctuates up and down by 15 pounds and can fluctuate by as much as 15 lbs in one day. She feels that her diet is relatively stable. She has gained about 10 pounds since last year.  Wt Readings from Last 3 Encounters:  11/01/13 163 lb (73.936 kg)  10/15/13 163 lb (73.936 kg)  10/12/13 162 lb (73.483 kg)   She sees Dr. Marin Olp for acute lymphoid leukemia which is in remission.   Low back Strain- saw Keith Rake NP and was treated with prednisone. Notes pain is nearly resolved.  Review of Systems  Constitutional: Negative for fever, chills and diaphoresis.   Dizziness has improved along with the migraines.  Past Medical History  Diagnosis Date  . 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 History of HPV infection     History   Social History  . Marital Status: Married    Spouse Name: N/A    Number of Children: 2  . Years of Education: N/A   Occupational History  .     Social History Main Topics  . Smoking status: Never Smoker   . Smokeless tobacco: Never Used  . Alcohol Use: 1.0 oz/week    2 drink(s) per week  . Drug Use: Not on file  . Sexual Activity: Not on file   Other Topics Concern  . 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 Port-a-cath removal  1997 or 1999?  Marland Kitchen Iud removal  2008    "IUD penetrated the abdomen and had to be surgically removed"    Family History  Problem Relation Age of Onset  . COPD Mother   . Hypertension Father   . Hyperlipidemia Father   . Cancer Paternal Uncle     lung  . Diabetes Maternal Grandmother   . Cancer Paternal Grandfather     brain  . Heart disease Neg Hx   . Stroke Neg Hx     No Known Allergies  Current Outpatient Prescriptions on File Prior to Visit  Medication Sig Dispense Refill  . aspirin-acetaminophen-caffeine (EXCEDRIN MIGRAINE) 250-250-65 MG per tablet Takes 1 tablet every 2 days as needed for headache.      . Fish Oil-Cholecalciferol (FISH OIL + D3 PO) Take by mouth every morning.      . Ginkgo Biloba 100 MG CAPS Take by mouth every morning.      . Vitamin D, Cholecalciferol, 1000 UNITS TABS Take by mouth every morning.  No current facility-administered medications on file prior to visit.    BP 120/76  Pulse 95  Temp(Src) 98.2 F (36.8 C) (Oral)  Resp 16  Ht 5' 5.5" (1.664 m)  Wt 163 lb (73.936 kg)  BMI 26.70 kg/m2  SpO2 99%  LMP 08/01/2013       Objective:   Physical Exam  Constitutional: She is oriented to person, place, and time. She appears well-developed and well-nourished. No distress.  HENT:  Head: Normocephalic and atraumatic.  Mouth/Throat: No oropharyngeal exudate.  Eyes: Pupils are equal, round, and reactive to light. No scleral icterus.  Cardiovascular: Normal rate, regular rhythm and normal heart sounds.   Pulmonary/Chest: Effort normal and breath sounds normal.  Lymphadenopathy:    She has no cervical adenopathy.  Neurological: She is alert and oriented to person, place, and time.  Skin: Skin is warm and dry. She is not diaphoretic.  Psychiatric: She has a normal mood and affect. Her  behavior is normal.          Assessment & Plan:  Patient seen by [facility] NP-student.  I have personally seen and examined patient and agree with Ms. Whitmire's assessment and plan.

## 2013-11-01 NOTE — Assessment & Plan Note (Signed)
Stable- management per Dr. Marin Olp.

## 2013-11-01 NOTE — Progress Notes (Signed)
Pre visit review using our clinic review tool, if applicable. No additional management support is needed unless otherwise documented below in the visit note. 

## 2013-11-01 NOTE — Patient Instructions (Signed)
Please schedule a fasting physical at your convenience this summer.

## 2013-11-01 NOTE — Assessment & Plan Note (Signed)
Resolved. Monitor.  

## 2013-12-27 ENCOUNTER — Encounter: Payer: Self-pay | Admitting: Family

## 2013-12-27 ENCOUNTER — Ambulatory Visit (INDEPENDENT_AMBULATORY_CARE_PROVIDER_SITE_OTHER): Payer: BC Managed Care – PPO | Admitting: Family

## 2013-12-27 VITALS — BP 112/78 | HR 90 | Temp 98.8°F | Resp 16 | Ht 65.5 in | Wt 167.0 lb

## 2013-12-27 DIAGNOSIS — Z23 Encounter for immunization: Secondary | ICD-10-CM

## 2013-12-27 DIAGNOSIS — Z0000 Encounter for general adult medical examination without abnormal findings: Secondary | ICD-10-CM

## 2013-12-27 NOTE — Progress Notes (Signed)
Subjective:    Patient ID: Rita Adkins, female    DOB: 1985-12-25, 28 y.o.   MRN: [id number]  HPI  Patient presents today for complete physical.  Immunizations: up to date Diet: reports diet is healthy. Misses breakfast.  + sodas Wt Readings from Last 3 Encounters:  12/27/13 167 lb (75.751 kg)  11/01/13 163 lb (73.936 kg)  10/15/13 163 lb (73.936 kg)  Exercise: reports that she does not exercise regularly Pap Smear: up to date  Review of Systems  Constitutional: Negative for unexpected weight change.  HENT: Negative for rhinorrhea.   Respiratory: Negative for cough and shortness of breath.   Cardiovascular: Negative for chest pain.  Gastrointestinal: Negative for vomiting, diarrhea and constipation.       Notes AM nausea, resolves within 1 hr of waking up- since she was a teenager.  Musculoskeletal: Negative for arthralgias.       Some left shoulder pain, worse in the AM's x 1 week.    Neurological:       Headaches are stable   Past Medical History  Diagnosis Date  . 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 History of HPV infection     History   Social History  . Marital Status: Married    Spouse Name: N/A    Number of Children: 2  . Years of Education: N/A   Occupational History  .     Social History Main Topics  . Smoking status: Never Smoker   . Smokeless tobacco: Never Used  . Alcohol Use: 1.0 oz/week    2 drink(s) per week  . Drug Use: Not on file  . Sexual Activity: Not on file   Other Topics Concern  . 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 Port-a-cath removal  1997 or 1999?  Marland Kitchen Iud removal  2008    "IUD penetrated the abdomen and had to be surgically removed"    Family History  Problem Relation Age of Onset  . COPD  Mother   . Hypertension Father   . Hyperlipidemia Father   . Cancer Paternal Uncle     lung  . Diabetes Maternal Grandmother   . Cancer Paternal Grandfather     brain  . Heart disease Neg Hx   . Stroke Neg Hx     No Known Allergies  Current Outpatient Prescriptions on File Prior to Visit  Medication Sig Dispense Refill  . aspirin-acetaminophen-caffeine (EXCEDRIN MIGRAINE) 250-250-65 MG per tablet Takes 1 tablet every 2 days as needed for headache.      . Fish Oil-Cholecalciferol (FISH OIL + D3 PO) Take by mouth every morning.      . Ginkgo Biloba 100 MG CAPS Take by mouth every morning.      . Levonorgestrel-Ethinyl Estradiol (AMETHIA,CAMRESE) 0.15-0.03 &0.01 MG tablet Take 1 tablet by mouth daily.  1 Package  5  . metoprolol tartrate (LOPRESSOR) 25 MG tablet TAKE 1/2 TABLET BY MOUTH TWICE DAILY  30 tablet  5  . SUMAtriptan (IMITREX) 50 MG tablet TAKE 1 TABLET BY MOUTH AT THE START OF MIGRAINE. MAY REPEAT IN 2 HOURS AS NEEDED. MAXIMUM OF 2 TABLETS PER 24 HOURS  10 tablet  5  . Vitamin D, Cholecalciferol, 1000 UNITS TABS Take by mouth every morning.       No current facility-administered medications on file prior to visit.    BP 112/78  Pulse 90  Temp(Src) 98.8 F (37.1 C) (Oral)  Resp 16  Ht 5' 5.5" (1.664 m)  Wt 167 lb (75.751 kg)  BMI 27.36 kg/m2  SpO2 99%  LMP 10/27/2013       Objective:   Physical Exam Physical Exam  Constitutional: She is oriented to person, place, and time. She appears well-developed and well-nourished. No distress.  HENT:  Head: Normocephalic and atraumatic.  Right Ear: Tympanic membrane and ear canal normal.  Left Ear: Tympanic membrane and ear canal normal.  Mouth/Throat: Oropharynx is clear and moist.  Eyes: Pupils are equal, round, and reactive to light. No scleral icterus.  Neck: Normal range of motion. No thyromegaly present.  Cardiovascular: Normal rate and regular rhythm.   No murmur heard. Pulmonary/Chest: Effort normal and breath  sounds normal. No respiratory distress. He has no wheezes. She has no rales. She exhibits no tenderness.  Abdominal: Soft. Bowel sounds are normal. He exhibits no distension and no mass. There is no tenderness. There is no rebound and no guarding.  Musculoskeletal: She exhibits no edema.  Lymphadenopathy:    She has no cervical adenopathy.  Neurological: She is alert and oriented to person, place, and time.  She exhibits normal muscle tone. Coordination normal.  Skin: Skin is warm and dry.  Psychiatric: She has a normal mood and affect. Her behavior is normal. Judgment and thought content normal.  Breasts: Examined lying Right: Without masses, retractions, discharge or axillary adenopathy.  Left: Without masses, retractions, discharge or axillary adenopathy.           Assessment & Plan:          Assessment & Plan:

## 2013-12-27 NOTE — Progress Notes (Signed)
Pre visit review using our clinic review tool, if applicable. No additional management support is needed unless otherwise documented below in the visit note. 

## 2013-12-27 NOTE — Patient Instructions (Signed)
Please return fasting for lab work at your convenience.

## 2013-12-30 NOTE — Assessment & Plan Note (Signed)
Discussed healthy diet, exercise, weight loss.  Flu shot and fasting labs today.

## 2014-01-14 ENCOUNTER — Ambulatory Visit (INDEPENDENT_AMBULATORY_CARE_PROVIDER_SITE_OTHER): Payer: BC Managed Care – PPO | Admitting: Medical

## 2014-01-14 ENCOUNTER — Encounter: Payer: Self-pay | Admitting: Medical

## 2014-01-14 ENCOUNTER — Other Ambulatory Visit: Payer: Self-pay

## 2014-01-14 ENCOUNTER — Telehealth: Payer: Self-pay | Admitting: *Deleted

## 2014-01-14 VITALS — BP 117/80 | HR 83 | Temp 99.0°F | Ht 65.2 in | Wt 165.2 lb

## 2014-01-14 DIAGNOSIS — M5431 Sciatica, right side: Secondary | ICD-10-CM

## 2014-01-14 DIAGNOSIS — M5430 Sciatica, unspecified side: Secondary | ICD-10-CM

## 2014-01-14 DIAGNOSIS — M5416 Radiculopathy, lumbar region: Secondary | ICD-10-CM | POA: Insufficient documentation

## 2014-01-14 MED ORDER — KETOROLAC TROMETHAMINE 60 MG/2ML IM SOLN
60.0000 mg | Freq: Once | INTRAMUSCULAR | Status: AC
  Administered 2014-01-14: 60 mg via INTRAMUSCULAR

## 2014-01-14 MED ORDER — CYCLOBENZAPRINE HCL 5 MG PO TABS: 5.0000 mg | ORAL_TABLET | Freq: Every day | ORAL | Status: DC

## 2014-01-14 MED ORDER — TRAMADOL HCL 50 MG PO TABS: 50.0000 mg | ORAL_TABLET | Freq: Four times a day (QID) | ORAL | Status: DC | PRN

## 2014-01-14 MED ORDER — KETOROLAC TROMETHAMINE 60 MG/2ML IM SOLN: 60.0000 mg | Freq: Once | INTRAMUSCULAR | Status: DC

## 2014-01-14 MED ORDER — DICLOFENAC SODIUM 75 MG PO TBEC: 75.0000 mg | DELAYED_RELEASE_TABLET | Freq: Two times a day (BID) | ORAL | Status: DC

## 2014-01-14 NOTE — Telephone Encounter (Signed)
Pt called due to concern. Pt stated that she has had back problems for "a while" and that Dr Marin Olp informed her to make him aware the next time she had an issue. Pt stated that she is going to call her PCP and try to arrange a visit for the back problems, however she wanted to make our office aware of the back pain. Informed pt to call our office back with any results; pt verbalized understanding.

## 2014-01-14 NOTE — Patient Instructions (Signed)
Your appear to have sciatica. We gave you a toradol injection today. Tomorrow I want you to start diclofenac(sent to your pharmacy) and stop any otc nsaids. I also sent muscle relaxant to use at night and I sent tramadol to use for moderate-severe pain. Advised back stretching exercises. If your pain persists then would likely get an lumbar xray. Follow up in 10-14 days or as needed.

## 2014-01-14 NOTE — Assessment & Plan Note (Signed)
toradol im in office today. Tomorrow start diclofenac. Use flexeril at night. Start tramadol for moderate to severe pain q 6 hrs as needed.

## 2014-01-14 NOTE — Progress Notes (Signed)
   Subjective:    Patient ID: Rita Adkins, female    DOB: [birth year], 28 y.o.   MRN: [id number]  HPI  Pt in states that in June she had some back pain. Pt states no acute injury or strain. But states she does work 2 jobs during the week. 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 No definite acute injury. Pt states she has history of some occasional joint pain. Pt states on and off pain in rt si area since June. This morning pain came back. She has pain that radiates all the way down to her ankle occasionally.  Pt is on Camrese ocp. Pt took advil and did not help much.  Some pain that radiates down her leg at time. But no incontinence, saddle anesthesia or leg weakness.   Review of Systems  Constitutional: Negative for fever and chills.  HENT: Negative.   Respiratory: Negative for cough, choking, wheezing and stridor.   Cardiovascular: Negative for chest pain and palpitations.  Gastrointestinal: Negative.   Musculoskeletal: Positive for back pain. Negative for joint swelling, myalgias, neck pain and neck stiffness.  Skin: Negative.   Neurological:       Occcasional radiating pain rt si area down her leg. But not constant.  Hematological: Negative for adenopathy. Does not bruise/bleed easily.       Objective:   Physical Exam  Constitutional: She is oriented to person, place, and time. She appears well-developed and well-nourished.  HENT:  Head: Normocephalic and atraumatic.  Eyes: Conjunctivae and EOM are normal. Pupils are equal, round, and reactive to light.  Neck: Normal range of motion. Neck supple.  Cardiovascular: Normal rate, regular rhythm and normal heart sounds.  Exam reveals no gallop and no friction rub.   No murmur heard. Pulmonary/Chest: Effort normal and breath sounds normal. No respiratory distress. She has no wheezes. She has no rales. She exhibits no tenderness.  Abdominal: Soft. Bowel sounds are normal. She exhibits no distension and no mass. There is no  tenderness. There is no rebound and no guarding.  Musculoskeletal:  Mid lumbar region not tender. Mild pain on straight leg lift. Mild lt si area tenderness.   Neurological: She is alert and oriented to person, place, and time.  Lower ext- l5-S1 sensation intact bilaterally. No foot drop. Equal and symmetric strength bilaterally.  Psychiatric: She has a normal mood and affect. Her behavior is normal. Judgment and thought content normal.          Assessment & Plan:

## 2014-02-22 ENCOUNTER — Ambulatory Visit: Payer: BC Managed Care – PPO | Admitting: Physician Assistant

## 2014-02-23 ENCOUNTER — Encounter: Payer: Self-pay | Admitting: Family

## 2014-02-23 ENCOUNTER — Ambulatory Visit: Payer: BC Managed Care – PPO | Admitting: Family

## 2014-02-23 ENCOUNTER — Telehealth: Payer: Self-pay | Admitting: *Deleted

## 2014-02-23 DIAGNOSIS — Z0289 Encounter for other administrative examinations: Secondary | ICD-10-CM

## 2014-02-23 NOTE — Telephone Encounter (Signed)
Noted, please send no show letter.

## 2014-02-23 NOTE — Telephone Encounter (Signed)
No Show Letter mailed 02/23/2014

## 2014-02-23 NOTE — Telephone Encounter (Signed)
Pt did not show for appointment 02/23/2014 at 9:45am for [facility]

## 2014-03-23 ENCOUNTER — Encounter: Payer: Self-pay | Admitting: Physician Assistant

## 2014-03-23 ENCOUNTER — Ambulatory Visit (INDEPENDENT_AMBULATORY_CARE_PROVIDER_SITE_OTHER): Payer: BC Managed Care – PPO | Admitting: Physician Assistant

## 2014-03-23 VITALS — BP 113/72 | HR 99 | Temp 98.3°F | Resp 16 | Ht 65.5 in | Wt 161.4 lb

## 2014-03-23 DIAGNOSIS — J019 Acute sinusitis, unspecified: Secondary | ICD-10-CM

## 2014-03-23 DIAGNOSIS — B9689 Other specified bacterial agents as the cause of diseases classified elsewhere: Secondary | ICD-10-CM | POA: Insufficient documentation

## 2014-03-23 MED ORDER — HYDROCOD POLST-CHLORPHEN POLST 10-8 MG/5ML PO LQCR: 5.0000 mL | Freq: Two times a day (BID) | ORAL | Status: DC | PRN

## 2014-03-23 MED ORDER — AMOXICILLIN-POT CLAVULANATE 875-125 MG PO TABS: 1.0000 | ORAL_TABLET | Freq: Two times a day (BID) | ORAL | Status: DC

## 2014-03-23 NOTE — Assessment & Plan Note (Signed)
Rx Augmentin.  Increase fluids.  Rest.  Saline nasal spray.  Probiotic.  Mucinex as directed.  Humidifier in bedroom. Tussionex for cough.  Call or return to clinic if symptoms are not improving.

## 2014-03-23 NOTE — Progress Notes (Signed)
History of Present Illness: ADAORA MCHANEY is a 28 y.o. female who present to the clinic today complaining of sinus pressure, sinus pain and nasal congestion with mild cough x 1 week. Patient endorses sore throat, ear pain and pressure, tooth pain and facial pain.  Patient denies fever, chills, SOB  Denies recent travel.   History: Past Medical History  Diagnosis Date  . 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 History of HPV infection    Current outpatient prescriptions: aspirin-acetaminophen-caffeine (EXCEDRIN MIGRAINE) 250-250-65 MG per tablet, Takes 1 tablet every 2 days as needed for headache., Disp: , Rfl: ;  diclofenac (VOLTAREN) 75 MG EC tablet, Take 1 tablet (75 mg total) by mouth 2 (two) times daily., Disp: 30 tablet, Rfl: 0;  Fish Oil-Cholecalciferol (FISH OIL + D3 PO), Take by mouth every morning., Disp: , Rfl:  Ginkgo Biloba 100 MG CAPS, Take by mouth every morning., Disp: , Rfl: ;  Levonorgestrel-Ethinyl Estradiol (AMETHIA,CAMRESE) 0.15-0.03 &0.01 MG tablet, Take 1 tablet by mouth daily., Disp: 1 Package, Rfl: 5;  metoprolol tartrate (LOPRESSOR) 25 MG tablet, TAKE 1/2 TABLET BY MOUTH TWICE DAILY, Disp: 30 tablet, Rfl: 5 SUMAtriptan (IMITREX) 50 MG tablet, TAKE 1 TABLET BY MOUTH AT THE START OF MIGRAINE. MAY REPEAT IN 2 HOURS AS NEEDED. MAXIMUM OF 2 TABLETS PER 24 HOURS, Disp: 10 tablet, Rfl: 5;  Vitamin D, Cholecalciferol, 1000 UNITS TABS, Take by mouth every morning., Disp: , Rfl: ;  amoxicillin-clavulanate (AUGMENTIN) 875-125 MG per tablet, Take 1 tablet by mouth 2 (two) times daily., Disp: 20 tablet, Rfl: 0 chlorpheniramine-HYDROcodone (TUSSIONEX PENNKINETIC ER) 10-8 MG/5ML LQCR, Take 5 mLs by mouth every 12 (twelve) hours as needed., Disp: 115 mL, Rfl: 0 No Known Allergies Family History  Problem Relation Age of Onset  . COPD Mother   . Hypertension Father   . Hyperlipidemia Father   . Cancer Paternal Uncle     lung  . Diabetes Maternal Grandmother   . Cancer Paternal  Grandfather     brain  . Heart disease Neg Hx   . Stroke Neg Hx    History   Social History  . Marital Status: Married    Spouse Name: N/A    Number of Children: 2  . Years of Education: N/A   Occupational History  .     Social History Main Topics  . Smoking status: Never Smoker   . Smokeless tobacco: Never Used  . Alcohol Use: 1.0 oz/week    2 drink(s) per week  . Drug Use: None  . Sexual Activity: None   Other Topics Concern  . 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  All other ROS are negative.  Physical Examination: BP 113/72 mmHg  Pulse 99  Temp(Src) 98.3 F (36.8 C) (Oral)  Resp 16  Ht 5' 5.5" (1.664 m)  Wt 161 lb 6 oz (73.199 kg)  BMI 26.44 kg/m2  SpO2 100%  LMP   General appearance: alert, cooperative and appears stated age Head: Normocephalic, without obvious abnormality, atraumatic, sinuses tender to percussion Eyes: conjunctivae/corneas clear. PERRL, EOM's intact. Fundi benign. Ears: normal TM's and external ear canals both ears Nose: moderate congestion, turbinates swollen, sinus tenderness bilateral Throat: lips, mucosa, and tongue  normal; teeth and gums normal Neck: no adenopathy, no carotid bruit, no JVD, supple, symmetrical, trachea midline and thyroid not enlarged, symmetric, no tenderness/mass/nodules Lungs: clear to auscultation bilaterally Chest wall: no tenderness  Assessment/Plan: Acute bacterial sinusitis Rx Augmentin.  Increase fluids.  Rest.  Saline nasal spray.  Probiotic.  Mucinex as directed.  Humidifier in bedroom. Tussionex for cough.  Call or return to clinic if symptoms are not improving.

## 2014-03-23 NOTE — Patient Instructions (Signed)
Please take antibiotic as directed.  Increase fluid intake.  Use Saline nasal spray.  Take a daily multivitamin. Advil sinus for congestion.  Place a humidifier in the bedroom.  Please call or return clinic if symptoms are not improving.  Sinusitis Sinusitis is redness, soreness, and swelling (inflammation) of the paranasal sinuses. Paranasal sinuses are air pockets within the bones of your face (beneath the eyes, the middle of the forehead, or above the eyes). In healthy paranasal sinuses, mucus is able to drain out, and air is able to circulate through them by way of your nose. However, when your paranasal sinuses are inflamed, mucus and air can become trapped. This can allow bacteria and other germs to grow and cause infection. Sinusitis can develop quickly and last only a short time (acute) or continue over a long period (chronic). Sinusitis that lasts for more than 12 weeks is considered chronic.  CAUSES  Causes of sinusitis include:  Allergies.  Structural abnormalities, such as displacement of the cartilage that separates your nostrils (deviated septum), which can decrease the air flow through your nose and sinuses and affect sinus drainage.  Functional abnormalities, such as when the small hairs (cilia) that line your sinuses and help remove mucus do not work properly or are not present. SYMPTOMS  Symptoms of acute and chronic sinusitis are the same. The primary symptoms are pain and pressure around the affected sinuses. Other symptoms include:  Upper toothache.  Earache.  Headache.  Bad breath.  Decreased sense of smell and taste.  A cough, which worsens when you are lying flat.  Fatigue.  Fever.  Thick drainage from your nose, which often is green and may contain pus (purulent).  Swelling and warmth over the affected sinuses. DIAGNOSIS  Your caregiver will perform a physical exam. During the exam, your caregiver may:  Look in your nose for signs of abnormal growths in  your nostrils (nasal polyps).  Tap over the affected sinus to check for signs of infection.  View the inside of your sinuses (endoscopy) with a special imaging device with a light attached (endoscope), which is inserted into your sinuses. If your caregiver suspects that you have chronic sinusitis, one or more of the following tests may be recommended:  Allergy tests.  Nasal culture A sample of mucus is taken from your nose and sent to a lab and screened for bacteria.  Nasal cytology A sample of mucus is taken from your nose and examined by your caregiver to determine if your sinusitis is related to an allergy. TREATMENT  Most cases of acute sinusitis are related to a viral infection and will resolve on their own within 10 days. Sometimes medicines are prescribed to help relieve symptoms (pain medicine, decongestants, nasal steroid sprays, or saline sprays).  However, for sinusitis related to a bacterial infection, your caregiver will prescribe antibiotic medicines. These are medicines that will help kill the bacteria causing the infection.  Rarely, sinusitis is caused by a fungal infection. In theses cases, your caregiver will prescribe antifungal medicine. For some cases of chronic sinusitis, surgery is needed. Generally, these are cases in which sinusitis recurs more than 3 times per year, despite other treatments. HOME CARE INSTRUCTIONS   Drink plenty of water. Water helps thin the mucus so your sinuses can drain more easily.  Use a humidifier.  Inhale steam 3 to 4 times a day (for example, sit in the bathroom with the shower running).  Apply a warm, moist washcloth to your face 3 to  4 times a day, or as directed by your caregiver.  Use saline nasal sprays to help moisten and clean your sinuses.  Take over-the-counter or prescription medicines for pain, discomfort, or fever only as directed by your caregiver. SEEK IMMEDIATE MEDICAL CARE IF:  You have increasing pain or severe  headaches.  You have nausea, vomiting, or drowsiness.  You have swelling around your face.  You have vision problems.  You have a stiff neck.  You have difficulty breathing. MAKE SURE YOU:   Understand these instructions.  Will watch your condition.  Will get help right away if you are not doing well or get worse. Document Released: 04/22/2005 Document Revised: 07/15/2011 Document Reviewed: 05/07/2011 [facility] Patient Information 2014 [place], [place].

## 2014-03-23 NOTE — Progress Notes (Signed)
Pre visit review using our clinic review tool, if applicable. No additional management support is needed unless otherwise documented below in the visit note/SLS  

## 2014-06-02 ENCOUNTER — Encounter: Payer: Self-pay | Admitting: Nurse Practitioner

## 2014-06-02 ENCOUNTER — Ambulatory Visit (INDEPENDENT_AMBULATORY_CARE_PROVIDER_SITE_OTHER): Payer: 59 | Admitting: Nurse Practitioner

## 2014-06-02 ENCOUNTER — Encounter: Payer: Self-pay | Admitting: *Deleted

## 2014-06-02 VITALS — BP 104/73 | HR 94 | Temp 99.1°F | Ht 65.5 in | Wt 160.0 lb

## 2014-06-02 DIAGNOSIS — J029 Acute pharyngitis, unspecified: Secondary | ICD-10-CM

## 2014-06-02 NOTE — Patient Instructions (Signed)
This is likely a viral sore throat/upper respiratory infection. However, if your culture comes back growing bacteria, I will call in an antibiotic. In the meantime, you may use benzocaine throat lozenges or throat spray for comfort. Salt water gargles (1/4 cup warm water mixed with 1/4 tsp salt) twice daily & listerene gargles twice daily. If you develop runny nose, start Neilmed sinus rinses daily.  Rest, sip fluids every hour.  Feel better!  Sore Throat A sore throat is pain, burning, irritation, or scratchiness of the throat. There is often pain or tenderness when swallowing or talking. A sore throat may be accompanied by other symptoms, such as coughing, sneezing, fever, and swollen neck glands. A sore throat is often the first sign of another sickness, such as a cold, flu, strep throat, or mononucleosis (commonly known as mono). Most sore throats go away without medical treatment. CAUSES  The most common causes of a sore throat include:  A viral infection, such as a cold, flu, or mono.  A bacterial infection, such as strep throat, tonsillitis, or whooping cough.  Seasonal allergies.  Dryness in the air.  Irritants, such as smoke or pollution.  Gastroesophageal reflux disease (GERD). HOME CARE INSTRUCTIONS   Only take over-the-counter medicines as directed by your caregiver.  Drink enough fluids to keep your urine clear or pale yellow.  Rest as needed.  Try using throat sprays, lozenges, or sucking on hard candy to ease any pain (if older than 4 years or as directed).  Sip warm liquids, such as broth, herbal tea, or warm water with honey to relieve pain temporarily. You may also eat or drink cold or frozen liquids such as frozen ice pops.  Gargle with salt water (mix 1 tsp salt with 8 oz of water).  Do not smoke and avoid secondhand smoke.  Put a cool-mist humidifier in your bedroom at night to moisten the air. You can also turn on a hot shower and sit in the bathroom with the  door closed for 5 10 minutes. SEEK IMMEDIATE MEDICAL CARE IF:  You have difficulty breathing.  You are unable to swallow fluids, soft foods, or your saliva.  You have increased swelling in the throat.  Your sore throat does not get better in 7 days.  You have nausea and vomiting.  You have a fever or persistent symptoms for more than 2 3 days.  You have a fever and your symptoms suddenly get worse. MAKE SURE YOU:   Understand these instructions.  Will watch your condition.  Will get help right away if you are not doing well or get worse. Document Released: 05/30/2004 Document Revised: 04/08/2012 Document Reviewed: 12/29/2011 [employer] Patient Information 2014 [employer]. 

## 2014-06-02 NOTE — Progress Notes (Signed)
Pre visit review using our clinic review tool, if applicable. No additional management support is needed unless otherwise documented below in the visit note. 

## 2014-06-03 NOTE — Progress Notes (Signed)
   Subjective:    Patient ID: Rita Adkins, female    DOB: 1986-03-21, 29 y.o.   MRN: [id number]  HPI Comments: Reviewed ONC notes. Last visit Oct, 2015. 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  Sore Throat  This is a new problem. The current episode started in the past 7 days (3d). The problem has been unchanged. The pain is worse on the left side. The maximum temperature recorded prior to her arrival was 100.4 - 100.9 F. The fever has been present for 3 to 4 days. The pain is mild. Associated symptoms include congestion, ear pain (left) and swollen glands. Pertinent negatives include no abdominal pain, coughing, diarrhea, headaches, hoarse voice, plugged ear sensation, shortness of breath, trouble swallowing or vomiting. She has had no exposure to strep. She has tried acetaminophen for the symptoms. The treatment provided mild relief.      Review of Systems  Constitutional: Positive for fatigue.  HENT: Positive for congestion and ear pain (left). Negative for hoarse voice and trouble swallowing.   Respiratory: Negative for cough, chest tightness, shortness of breath and wheezing.   Cardiovascular: Negative for chest pain.  Gastrointestinal: Negative for nausea, vomiting, abdominal pain and diarrhea.  Musculoskeletal: Negative for back pain.  Skin: Negative for rash.  Neurological: Negative for headaches.       Objective:   Physical Exam  Constitutional: She is oriented to person, place, and time. She appears well-developed and well-nourished. No distress.  HENT:  Head: Normocephalic and atraumatic.  Right Ear: External ear normal.  Left Ear: External ear normal.  Mouth/Throat: No oropharyngeal exudate.  Tonsils +2, L slightly redder than R, no exudate  Eyes: Conjunctivae are normal. Right eye exhibits no discharge. Left eye exhibits no discharge.  Neck: Normal range of motion. Neck supple. No thyromegaly present.  Cardiovascular: Normal  rate, regular rhythm and normal heart sounds.   No murmur heard. Pulmonary/Chest: Effort normal and breath sounds normal. No respiratory distress. She has no wheezes. She has no rales.  Lymphadenopathy:    She has no cervical adenopathy.  Neurological: She is alert and oriented to person, place, and time.  Skin: Skin is warm and dry.  Psychiatric: She has a normal mood and affect. Her behavior is normal. Thought content normal.  Vitals reviewed.         Assessment & Plan:  1. Sore throat DD: viral pharyngitis, flu, Strep pharyngitis - Upper Respiratory Culture-pending - POCT rapid strep A-Neg Symptom management: listerene gargles, salt water gargles, tylenol PRN, sinus rinse, rest See pt intrsuctions F/u PRN

## 2014-06-05 LAB — CULTURE, UPPER RESPIRATORY: ORGANISM ID, BACTERIA: NORMAL

## 2014-06-06 ENCOUNTER — Telehealth: Payer: Self-pay | Admitting: Nurse Practitioner

## 2014-06-06 NOTE — Telephone Encounter (Signed)
Left detailed message on pt's vm. Per pt's DPR.

## 2014-06-06 NOTE — Telephone Encounter (Signed)
pls call pt: Strep culture neg. Viral respiratory/sore throat illness. Continue with comfort measures as discussed. 

## 2014-07-13 ENCOUNTER — Telehealth: Payer: Self-pay | Admitting: Family

## 2014-07-13 ENCOUNTER — Encounter: Payer: Self-pay | Admitting: Medical

## 2014-07-13 ENCOUNTER — Ambulatory Visit: Payer: 59 | Admitting: Medical

## 2014-07-13 ENCOUNTER — Ambulatory Visit (HOSPITAL_BASED_OUTPATIENT_CLINIC_OR_DEPARTMENT_OTHER)
Admission: RE | Admit: 2014-07-13 | Discharge: 2014-07-13 | Disposition: A | Discharge status: Home / Self Care | Payer: 59 | Source: Ambulatory Visit | Attending: Medical | Admitting: Medical

## 2014-07-13 ENCOUNTER — Ambulatory Visit (INDEPENDENT_AMBULATORY_CARE_PROVIDER_SITE_OTHER): Payer: 59 | Admitting: Medical

## 2014-07-13 VITALS — BP 124/82 | HR 79 | Temp 98.4°F | Wt 165.2 lb

## 2014-07-13 DIAGNOSIS — R0781 Pleurodynia: Secondary | ICD-10-CM

## 2014-07-13 MED ORDER — HYDROCODONE-ACETAMINOPHEN 5-325 MG PO TABS: 1.0000 | ORAL_TABLET | Freq: Four times a day (QID) | ORAL | Status: DC | PRN

## 2014-07-13 MED ORDER — DICLOFENAC SODIUM 75 MG PO TBEC: 75.0000 mg | DELAYED_RELEASE_TABLET | Freq: Two times a day (BID) | ORAL | Status: DC

## 2014-07-13 NOTE — Assessment & Plan Note (Addendum)
Possible fracture of lower rib near costochandral junction.  Rx of diclofenac for mild-moderate pain. Rx of hydrocodone for more severe pain such as at night.

## 2014-07-13 NOTE — Patient Instructions (Signed)
We will get xray of chest today to evaluate area of pain.  Rib pain on left side Possible fracture of lower rib near costochandral junction.  Rx of diclofenac for mild-moderate pain. Rx of hydrocodone for more severe pain such as at night.     Follow up in 3 wks or as needed.

## 2014-07-13 NOTE — Telephone Encounter (Signed)
Caller name:Deridder, Neeva Relation to RJ:[phone] Pharmacy:  Reason for call: pt returning the call, regarding her x ray results, that were done today, please call back.

## 2014-07-13 NOTE — Progress Notes (Signed)
Subjective:    Patient ID: Rita Adkins, female    DOB: 06-05-1985, 29 y.o.   MRN: [id number]  HPI   Pt in states husband was trying to pop her back on Monday night. When he did this she felt and heard a pop in her lower rib/junction of her costochandral area. Since then it is hurting her to move her thorax. And breathing coughing and laughing increases.  LMP- in December. Pt is on Cambrese. She gets menstrual cycle every 3 months. Pt expects to get menses in 2 wks. States within 3 month time frame. On med for 1.5 years.  Pt has known osteopenia.   Pt is on voltaren presently.  Review of Systems  Constitutional: Negative for fever, chills and fatigue.  Respiratory: Negative for cough, choking, shortness of breath and wheezing.   Cardiovascular: Negative for chest pain, palpitations and leg swelling.  Musculoskeletal: Negative for back pain.       Left lower rib area pain. Palpation, breathing and moving thorax.  No leg pain.  Neurological: Negative for headaches.  Hematological: Negative for adenopathy. Does not bruise/bleed easily.  Psychiatric/Behavioral: Negative for confusion.   Past Medical History  Diagnosis Date  . 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 History of HPV infection     History   Social History  . Marital Status: Married    Spouse Name: N/A  . Number of Children: 2  . Years of Education: N/A   Occupational History  .     Social History Main Topics  . Smoking status: Never Smoker   . Smokeless tobacco: Never Used  . Alcohol Use: 1.0 oz/week    2 drink(s) per week  . Drug Use: Not on file  . Sexual Activity: Not on file   Other Topics Concern  . 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 Port-a-cath removal  1997 or 1999?   Marland Kitchen Iud removal  2008    "IUD penetrated the abdomen and had to be surgically removed"    Family History  Problem Relation Age of Onset  . COPD Mother   . Hypertension Father   . Hyperlipidemia Father   . Cancer Paternal Uncle     lung  . Diabetes Maternal Grandmother   . Cancer Paternal Grandfather     brain  . Heart disease Neg Hx   . Stroke Neg Hx     No Known Allergies  Current Outpatient Prescriptions on File Prior to Visit  Medication Sig Dispense Refill  . aspirin-acetaminophen-caffeine (EXCEDRIN MIGRAINE) 250-250-65 MG per tablet Takes 1 tablet every 2 days as needed for headache.    . Fish Oil-Cholecalciferol (FISH OIL + D3 PO) Take by mouth every morning.    . Ginkgo Biloba 100 MG CAPS Take by mouth every morning.    . Levonorgestrel-Ethinyl Estradiol (AMETHIA,CAMRESE) 0.15-0.03 &0.01 MG tablet Take 1 tablet by mouth daily. 1 Package 5  . metoprolol tartrate (LOPRESSOR) 25 MG tablet TAKE 1/2 TABLET BY MOUTH TWICE DAILY 30 tablet 5  . SUMAtriptan (IMITREX) 50 MG tablet TAKE 1 TABLET BY MOUTH AT THE START OF MIGRAINE. MAY REPEAT IN 2 HOURS AS NEEDED. MAXIMUM OF 2 TABLETS  PER 24 HOURS 10 tablet 5  . Vitamin D, Cholecalciferol, 1000 UNITS TABS Take by mouth every morning.     No current facility-administered medications on file prior to visit.    BP 124/82 mmHg  Pulse 79  Temp(Src) 98.4 F (36.9 C) (Oral)  Wt 165 lb 3.2 oz (74.934 kg)  SpO2 98%  LMP 04/19/2014       Objective:   Physical Exam  General- No acute distress. Pleasant patient. Neck- Full range of motion, no jvd Lungs- Clear, even and unlabored. Heart- regular rate and rhythm. Neurologic- CNII- XII grossly intact. Lt lower rib pain- breathing, twisting thorax and movement. Lower rib/junction of sternum/costochandral region. Back-no cva pain.       Assessment & Plan:

## 2014-07-14 ENCOUNTER — Telehealth: Payer: Self-pay | Admitting: Family

## 2014-07-14 NOTE — Telephone Encounter (Signed)
Called pharmacy, states Rx had no DEA#. Number given.

## 2014-07-14 NOTE — Telephone Encounter (Signed)
Caller name: An Relation to pt: self Call back number: [phone] Pharmacy:  Reason for call:   Patient states that she dropped off the hydrocodone rx at the pharmacy but that the pharmacy could not fill because a # was missing off of the rx. Edward prescribed

## 2014-07-19 ENCOUNTER — Emergency Department (HOSPITAL_BASED_OUTPATIENT_CLINIC_OR_DEPARTMENT_OTHER): Payer: 59

## 2014-07-19 ENCOUNTER — Encounter (HOSPITAL_BASED_OUTPATIENT_CLINIC_OR_DEPARTMENT_OTHER): Payer: Self-pay | Admitting: Emergency Medicine

## 2014-07-19 ENCOUNTER — Emergency Department (HOSPITAL_BASED_OUTPATIENT_CLINIC_OR_DEPARTMENT_OTHER)
Admission: EM | Admit: 2014-07-19 | Discharge: 2014-07-19 | Disposition: A | Discharge status: Home / Self Care | Payer: 59 | Attending: Emergency Medicine | Admitting: Emergency Medicine

## 2014-07-19 DIAGNOSIS — Z856 Personal history of leukemia: Secondary | ICD-10-CM | POA: Diagnosis not present

## 2014-07-19 DIAGNOSIS — Y9389 Activity, other specified: Secondary | ICD-10-CM | POA: Diagnosis not present

## 2014-07-19 DIAGNOSIS — Z791 Long term (current) use of non-steroidal anti-inflammatories (NSAID): Secondary | ICD-10-CM | POA: Insufficient documentation

## 2014-07-19 DIAGNOSIS — Z793 Long term (current) use of hormonal contraceptives: Secondary | ICD-10-CM | POA: Diagnosis not present

## 2014-07-19 DIAGNOSIS — Y998 Other external cause status: Secondary | ICD-10-CM | POA: Diagnosis not present

## 2014-07-19 DIAGNOSIS — X58XXXA Exposure to other specified factors, initial encounter: Secondary | ICD-10-CM | POA: Insufficient documentation

## 2014-07-19 DIAGNOSIS — Z3202 Encounter for pregnancy test, result negative: Secondary | ICD-10-CM | POA: Diagnosis not present

## 2014-07-19 DIAGNOSIS — S299XXA Unspecified injury of thorax, initial encounter: Secondary | ICD-10-CM | POA: Diagnosis present

## 2014-07-19 DIAGNOSIS — Z8619 Personal history of other infectious and parasitic diseases: Secondary | ICD-10-CM | POA: Insufficient documentation

## 2014-07-19 DIAGNOSIS — S20219A Contusion of unspecified front wall of thorax, initial encounter: Secondary | ICD-10-CM | POA: Insufficient documentation

## 2014-07-19 DIAGNOSIS — Z79899 Other long term (current) drug therapy: Secondary | ICD-10-CM | POA: Insufficient documentation

## 2014-07-19 DIAGNOSIS — T148XXA Other injury of unspecified body region, initial encounter: Secondary | ICD-10-CM

## 2014-07-19 DIAGNOSIS — Y9289 Other specified places as the place of occurrence of the external cause: Secondary | ICD-10-CM | POA: Diagnosis not present

## 2014-07-19 LAB — URINE MICROSCOPIC-ADD ON

## 2014-07-19 LAB — URINALYSIS, ROUTINE W REFLEX MICROSCOPIC
BILIRUBIN URINE: NEGATIVE
Glucose, UA: NEGATIVE mg/dL
KETONES UR: NEGATIVE mg/dL
NITRITE: NEGATIVE
PH: 7 (ref 5.0–8.0)
PROTEIN: NEGATIVE mg/dL
Specific Gravity, Urine: 1.023 (ref 1.005–1.030)
Urobilinogen, UA: 1 mg/dL (ref 0.0–1.0)

## 2014-07-19 LAB — PREGNANCY, URINE: Preg Test, Ur: NEGATIVE

## 2014-07-19 MED ORDER — NAPROXEN 500 MG PO TABS: 500.0000 mg | ORAL_TABLET | Freq: Two times a day (BID) | ORAL | Status: DC

## 2014-07-19 MED ORDER — NAPROXEN 250 MG PO TABS
500.0000 mg | ORAL_TABLET | Freq: Once | ORAL | Status: AC
  Administered 2014-07-19: 500 mg via ORAL
  Filled 2014-07-19: qty 2

## 2014-07-19 NOTE — ED Notes (Signed)
Patient states that she was seen and treated for rib cartilage fracture last week. Today she felt her left rib pop

## 2014-07-19 NOTE — Discharge Instructions (Signed)
Cryotherapy °Cryotherapy means treatment with cold. Ice or gel packs can be used to reduce both pain and swelling. Ice is the most helpful within the first 24 to 48 hours after an injury or flare-up from overusing a muscle or joint. Sprains, strains, spasms, burning pain, shooting pain, and aches can all be eased with ice. Ice can also be used when recovering from surgery. Ice is effective, has very few side effects, and is safe for most people to use. °PRECAUTIONS  °Ice is not a safe treatment option for people with: °· Raynaud phenomenon. This is a condition affecting small blood vessels in the extremities. Exposure to cold may cause your problems to return. °· Cold hypersensitivity. There are many forms of cold hypersensitivity, including: °¨ Cold urticaria. Red, itchy hives appear on the skin when the tissues begin to warm after being iced. °¨ Cold erythema. This is a red, itchy rash caused by exposure to cold. °¨ Cold hemoglobinuria. Red blood cells break down when the tissues begin to warm after being iced. The hemoglobin that carry oxygen are passed into the urine because they cannot combine with blood proteins fast enough. °· Numbness or altered sensitivity in the area being iced. °If you have any of the following conditions, do not use ice until you have discussed cryotherapy with your caregiver: °· Heart conditions, such as arrhythmia, angina, or chronic heart disease. °· High blood pressure. °· Healing wounds or open skin in the area being iced. °· Current infections. °· Rheumatoid arthritis. °· Poor circulation. °· Diabetes. °Ice slows the blood flow in the region it is applied. This is beneficial when trying to stop inflamed tissues from spreading irritating chemicals to surrounding tissues. However, if you expose your skin to cold temperatures for too long or without the proper protection, you can damage your skin or nerves. Watch for signs of skin damage due to cold. °HOME CARE INSTRUCTIONS °Follow  these tips to use ice and cold packs safely. °· Place a dry or damp towel between the ice and skin. A damp towel will cool the skin more quickly, so you may need to shorten the time that the ice is used. °· For a more rapid response, add gentle compression to the ice. °· Ice for no more than 10 to 20 minutes at a time. The bonier the area you are icing, the less time it will take to get the benefits of ice. °· Check your skin after 5 minutes to make sure there are no signs of a poor response to cold or skin damage. °· Rest 20 minutes or more between uses. °· Once your skin is numb, you can end your treatment. You can test numbness by very lightly touching your skin. The touch should be so light that you do not see the skin dimple from the pressure of your fingertip. When using ice, most people will feel these normal sensations in this order: cold, burning, aching, and numbness. °· Do not use ice on someone who cannot communicate their responses to pain, such as small children or people with dementia. °HOW TO MAKE AN ICE PACK °Ice packs are the most common way to use ice therapy. Other methods include ice massage, ice baths, and cryosprays. Muscle creams that cause a cold, tingly feeling do not offer the same benefits that ice offers and should not be used as a substitute unless recommended by your caregiver. °To make an ice pack, do one of the following: °· Place crushed ice or a   bag of frozen vegetables in a sealable plastic bag. Squeeze out the excess air. Place this bag inside another plastic bag. Slide the bag into a pillowcase or place a damp towel between your skin and the bag. °· Mix 3 parts water with 1 part rubbing alcohol. Freeze the mixture in a sealable plastic bag. When you remove the mixture from the freezer, it will be slushy. Squeeze out the excess air. Place this bag inside another plastic bag. Slide the bag into a pillowcase or place a damp towel between your skin and the bag. °SEEK MEDICAL CARE  IF: °· You develop white spots on your skin. This may give the skin a blotchy (mottled) appearance. °· Your skin turns blue or pale. °· Your skin becomes waxy or hard. °· Your swelling gets worse. °MAKE SURE YOU:  °· Understand these instructions. °· Will watch your condition. °· Will get help right away if you are not doing well or get worse. °Document Released: 12/17/2010 Document Revised: 09/06/2013 Document Reviewed: 12/17/2010 °[employer]® Patient Information ©2015 [employer]. This information is not intended to replace advice given to you by your health care provider. Make sure you discuss any questions you have with your health care provider. ° °

## 2014-07-19 NOTE — ED Provider Notes (Signed)
CSN: [id number]     Arrival date & time 07/19/14  0008 History   First MD Initiated Contact with Patient 07/19/14 0214     Chief Complaint  Patient presents with  . Rib Injury     (Consider location/radiation/quality/duration/timing/severity/associated sxs/prior Treatment) The history is provided by the patient.  Had a rib injury a week or so ago and moved tonight and felt a pop.  No SOB, no doe no n/v/d.  No cough.  No vomiting no abdominal pain.  No bruising on the skin.    Past Medical History  Diagnosis Date  . 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 History of HPV infection    Past Surgical History  Procedure Laterality Date  . Port-a-cath removal  1997 or 1999?  Marland Kitchen Iud removal  2008    "IUD penetrated the abdomen and had to be surgically removed"   Family History  Problem Relation Age of Onset  . COPD Mother   . Hypertension Father   . Hyperlipidemia Father   . Cancer Paternal Uncle     lung  . Diabetes Maternal Grandmother   . Cancer Paternal Grandfather     brain  . Heart disease Neg Hx   . Stroke Neg Hx    History  Substance Use Topics  . Smoking status: Never Smoker   . Smokeless tobacco: Never Used  . Alcohol Use: 1.0 oz/week    2 drink(s) per week   OB History    No data available     Review of Systems  Constitutional: Negative for fever.  Respiratory: Negative for cough and shortness of breath.   Gastrointestinal: Negative for nausea, vomiting, abdominal pain and diarrhea.  Skin: Negative for color change.  Hematological: Does not bruise/bleed easily.  All other systems reviewed and are negative.     Allergies  Review of patient's allergies indicates no known allergies.  Home Medications   Prior to Admission medications   Medication Sig Start Date End Date Taking? Authorizing Provider  aspirin-acetaminophen-caffeine (EXCEDRIN MIGRAINE) [phone] MG per tablet Takes 1 tablet every 2 days as needed for headache.    Historical Provider,  MD  diclofenac (VOLTAREN) 75 MG EC tablet Take 1 tablet (75 mg total) by mouth 2 (two) times daily. 07/13/14   [place], PA-C  Fish Oil-Cholecalciferol (FISH OIL + D3 PO) Take by mouth every morning.    Historical Provider, MD  Ginkgo Biloba 100 MG CAPS Take by mouth every morning.    Historical Provider, MD  HYDROcodone-acetaminophen (NORCO) 5-325 MG per tablet Take 1 tablet by mouth every 6 (six) hours as needed for moderate pain. 07/13/14   Meriam Sprague Saguier, PA-C  Levonorgestrel-Ethinyl Estradiol (AMETHIA,CAMRESE) 0.15-0.03 &0.01 MG tablet Take 1 tablet by mouth daily. 11/01/13   Debbrah Alar, NP  metoprolol tartrate (LOPRESSOR) 25 MG tablet TAKE 1/2 TABLET BY MOUTH TWICE DAILY 11/01/13   Debbrah Alar, NP  naproxen (NAPROSYN) 500 MG tablet Take 1 tablet (500 mg total) by mouth 2 (two) times daily. 07/19/14   Chenita Ruda, MD  SUMAtriptan (IMITREX) 50 MG tablet TAKE 1 TABLET BY MOUTH AT THE START OF MIGRAINE. MAY REPEAT IN 2 HOURS AS NEEDED. MAXIMUM OF 2 TABLETS PER 24 HOURS 11/01/13   Debbrah Alar, NP  Vitamin D, Cholecalciferol, 1000 UNITS TABS Take by mouth every morning.    Historical Provider, MD   BP 113/78 mmHg  Pulse 77  Temp(Src) 98.5 F (36.9 C) (Oral)  Resp  16  Ht 5\' 7"  (1.702 m)  Wt 163 lb (73.936 kg)  BMI 25.52 kg/m2  SpO2 100%  LMP 04/19/2014 Physical Exam  Constitutional: She is oriented to person, place, and time. She appears well-developed and well-nourished.  HENT:  Head: Normocephalic and atraumatic.  Mouth/Throat: Oropharynx is clear and moist.  Eyes: Conjunctivae are normal. Pupils are equal, round, and reactive to light.  Neck: Normal range of motion. Neck supple.  Cardiovascular: Normal rate, regular rhythm and intact distal pulses.   Pulmonary/Chest: Effort normal and breath sounds normal. No respiratory distress. She has no wheezes. She has no rales. She exhibits no tenderness.  Abdominal: Soft. Bowel sounds are normal. There is no  tenderness. There is no rebound and no guarding.  Musculoskeletal: Normal range of motion.  Neurological: She is alert and oriented to person, place, and time.  Skin: Skin is warm and dry.  Psychiatric: She has a normal mood and affect.    ED Course  Procedures (including critical care time) Labs Review Labs Reviewed  URINALYSIS, ROUTINE W REFLEX MICROSCOPIC - Abnormal; Notable for the following:    Hgb urine dipstick TRACE (*)    Leukocytes, UA SMALL (*)    All other components within normal limits  URINE MICROSCOPIC-ADD ON - Abnormal; Notable for the following:    Bacteria, UA FEW (*)    All other components within normal limits  PREGNANCY, URINE    Imaging Review Dg Chest 2 View  07/19/2014   CLINICAL DATA:  Felt pop on the left side of the chest while working in the yard. Difficulty taking deep breath. Initial encounter.  EXAM: CHEST  2 VIEW  COMPARISON:  None.  FINDINGS: No displaced rib fracture is seen.  The lungs are well-aerated and clear. There is no evidence of focal opacification, pleural effusion or pneumothorax.  The heart is normal in size; the mediastinal contour is within normal limits. No acute osseous abnormalities are seen.  IMPRESSION: No displaced rib fracture seen; no acute cardiopulmonary process identified.   Electronically Signed   By: Garald Balding M.D.   On: 07/19/2014 00:54     EKG Interpretation None      MDM   Final diagnoses:  Contusion    NSAIDS and ice and close follow up with your PMD   Ilham Roughton, MD 07/19/14 [phone]

## 2014-07-28 ENCOUNTER — Ambulatory Visit: Payer: 59 | Admitting: Family

## 2014-08-01 ENCOUNTER — Telehealth: Payer: Self-pay | Admitting: *Deleted

## 2014-08-01 NOTE — Telephone Encounter (Signed)
Prior authorization for sumatriptan initiated. Awaiting determination. JG//CMA

## 2014-08-03 ENCOUNTER — Encounter: Payer: Self-pay | Admitting: Family

## 2014-08-03 ENCOUNTER — Telehealth: Payer: Self-pay | Admitting: Family

## 2014-08-03 NOTE — Telephone Encounter (Signed)
PT was no show for appt on 07/28/14- letter sent- charge?

## 2014-08-05 NOTE — Telephone Encounter (Signed)
Yes please

## 2014-08-22 NOTE — Telephone Encounter (Signed)
Late entry:  [employer] stated that PA is not required and medication is covered on pt's plan. JG//CMA

## 2014-09-19 ENCOUNTER — Ambulatory Visit (INDEPENDENT_AMBULATORY_CARE_PROVIDER_SITE_OTHER): Payer: 59 | Admitting: Family

## 2014-09-19 ENCOUNTER — Encounter: Payer: Self-pay | Admitting: Family

## 2014-09-19 ENCOUNTER — Telehealth: Payer: Self-pay | Admitting: Family

## 2014-09-19 VITALS — BP 100/76 | HR 96 | Temp 98.3°F | Resp 16 | Ht 65.5 in | Wt 163.2 lb

## 2014-09-19 DIAGNOSIS — M5417 Radiculopathy, lumbosacral region: Secondary | ICD-10-CM

## 2014-09-19 DIAGNOSIS — M5416 Radiculopathy, lumbar region: Secondary | ICD-10-CM

## 2014-09-19 LAB — POCT URINE HCG BY VISUAL COLOR COMPARISON TESTS: PREG TEST UR: NEGATIVE

## 2014-09-19 MED ORDER — METHYLPREDNISOLONE 4 MG PO TBPK: ORAL_TABLET | ORAL | Status: DC

## 2014-09-19 NOTE — Progress Notes (Signed)
Subjective:    Patient ID: Rita Adkins, female    DOB: 05-19-1985, 29 y.o.   MRN: [id number]  HPI   Rita Adkins is a 29 yr old female who presents today with chief complaint of right sided low back pain. Reports that pain has been present since last year.  Reports intermittent numbness down the right leg.  Reports that she has had increased pain/numbness of the right leg with standing at work. Reports that the pain comes and goes.  If she has pain she has numbness. Worse with standing still or prolonged driving. Pain is better with back exercises but  She reports that she has a new position at her job, she is now standing a lot during the day.    Review of Systems    see HPI  Past Medical History  Diagnosis Date  . 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 History of HPV infection     History   Social History  . Marital Status: Married    Spouse Name: N/A  . Number of Children: 2  . Years of Education: N/A   Occupational History  .     Social History Main Topics  . Smoking status: Never Smoker   . Smokeless tobacco: Never Used  . Alcohol Use: 1.0 oz/week    2 drink(s) per week  . Drug Use: Not on file  . Sexual Activity: Not on file   Other Topics Concern  . 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 Port-a-cath removal  1997 or 1999?  Marland Kitchen Iud removal  2008    "IUD penetrated the abdomen and had to be surgically removed"    Family History  Problem Relation Age of Onset  . COPD Mother   . Hypertension Father   . Hyperlipidemia Father   . Cancer Paternal Uncle     lung  . Diabetes Maternal Grandmother   . Cancer Paternal Grandfather     brain  . Heart disease Neg Hx   . Stroke Neg Hx     No Known Allergies  Current Outpatient  Prescriptions on File Prior to Visit  Medication Sig Dispense Refill  . aspirin-acetaminophen-caffeine (EXCEDRIN MIGRAINE) 250-250-65 MG per tablet Takes 1 tablet every 2 days as needed for headache.    . Fish Oil-Cholecalciferol (FISH OIL + D3 PO) Take by mouth every morning.    . Ginkgo Biloba 100 MG CAPS Take by mouth every morning.    . Levonorgestrel-Ethinyl Estradiol (AMETHIA,CAMRESE) 0.15-0.03 &0.01 MG tablet Take 1 tablet by mouth daily. 1 Package 5  . metoprolol tartrate (LOPRESSOR) 25 MG tablet TAKE 1/2 TABLET BY MOUTH TWICE DAILY 30 tablet 5  . SUMAtriptan (IMITREX) 50 MG tablet TAKE 1 TABLET BY MOUTH AT THE START OF MIGRAINE. MAY REPEAT IN 2 HOURS AS NEEDED. MAXIMUM OF 2 TABLETS PER 24 HOURS 10 tablet 5  . Vitamin D, Cholecalciferol, 1000 UNITS TABS Take by mouth every morning.     No current facility-administered medications on file prior to visit.    BP 100/76 mmHg  Pulse 96  Temp(Src) 98.3 F (36.8 C) (Oral)  Resp 16  Ht 5' 5.5" (1.664 m)  [facility]  163 lb 3.2 oz (74.027 kg)  BMI 26.74 kg/m2  SpO2 99%  LMP 08/01/2014    Objective:   Physical Exam  Constitutional: She appears well-developed and well-nourished.  Cardiovascular: Normal rate, regular rhythm and normal heart sounds.   No murmur heard. Pulmonary/Chest: Effort normal and breath sounds normal. No respiratory distress. She has no wheezes.  Musculoskeletal:       Lumbar back: She exhibits no tenderness.  Neurological:  Bilateral LE strength is 5/5 Mildly positive right staight leg rais.   Psychiatric: She has a normal mood and affect. Her behavior is normal. Judgment and thought content normal.          Assessment & Plan:

## 2014-09-19 NOTE — Addendum Note (Signed)
Addended by: Kelle Darting A on: 09/19/2014 04:41 PM   Modules accepted: Orders

## 2014-09-19 NOTE — Telephone Encounter (Signed)
Caller name: Shalonda Relation to pt: self Call back number: [phone] Pharmacy:  Reason for call:   Patient requesting work note stating that she was seen today. Will pick up tomorrow

## 2014-09-19 NOTE — Assessment & Plan Note (Signed)
Deteriorated. Will rx with medrol dose pak.  Refer for MRI of the lumbar spine.

## 2014-09-19 NOTE — Telephone Encounter (Signed)
Letter printed and placed at front desk for pick up.

## 2014-09-19 NOTE — Patient Instructions (Signed)
Start medrol dose pak for low back pain. You will be contacted about your MRI. Follow up in 1 month.

## 2014-09-19 NOTE — Progress Notes (Signed)
Pre visit review using our clinic review tool, if applicable. No additional management support is needed unless otherwise documented below in the visit note. 

## 2014-09-24 ENCOUNTER — Ambulatory Visit (HOSPITAL_BASED_OUTPATIENT_CLINIC_OR_DEPARTMENT_OTHER)
Admission: RE | Admit: 2014-09-24 | Discharge: 2014-09-24 | Disposition: A | Discharge status: Home / Self Care | Payer: 59 | Source: Ambulatory Visit | Attending: Family | Admitting: Family

## 2014-09-24 DIAGNOSIS — M5416 Radiculopathy, lumbar region: Secondary | ICD-10-CM

## 2014-09-24 DIAGNOSIS — M5417 Radiculopathy, lumbosacral region: Secondary | ICD-10-CM | POA: Diagnosis present

## 2014-09-26 ENCOUNTER — Encounter: Payer: Self-pay | Admitting: Family

## 2014-09-26 DIAGNOSIS — R200 Anesthesia of skin: Secondary | ICD-10-CM

## 2014-10-14 ENCOUNTER — Ambulatory Visit (HOSPITAL_BASED_OUTPATIENT_CLINIC_OR_DEPARTMENT_OTHER): Payer: 59 | Admitting: Hematology & Oncology

## 2014-10-14 ENCOUNTER — Encounter: Payer: Self-pay | Admitting: Family

## 2014-10-14 ENCOUNTER — Other Ambulatory Visit (HOSPITAL_BASED_OUTPATIENT_CLINIC_OR_DEPARTMENT_OTHER): Payer: 59

## 2014-10-14 ENCOUNTER — Ambulatory Visit (INDEPENDENT_AMBULATORY_CARE_PROVIDER_SITE_OTHER): Payer: 59 | Admitting: Family

## 2014-10-14 ENCOUNTER — Encounter: Payer: Self-pay | Admitting: Hematology & Oncology

## 2014-10-14 VITALS — BP 177/78 | HR 83 | Temp 98.1°F | Resp 14 | Ht 65.0 in | Wt 166.0 lb

## 2014-10-14 VITALS — BP 102/70 | HR 78 | Temp 97.9°F | Resp 16 | Ht 65.5 in | Wt 167.2 lb

## 2014-10-14 DIAGNOSIS — M810 Age-related osteoporosis without current pathological fracture: Secondary | ICD-10-CM

## 2014-10-14 DIAGNOSIS — K219 Gastro-esophageal reflux disease without esophagitis: Secondary | ICD-10-CM | POA: Insufficient documentation

## 2014-10-14 DIAGNOSIS — Z856 Personal history of leukemia: Secondary | ICD-10-CM

## 2014-10-14 DIAGNOSIS — L309 Dermatitis, unspecified: Secondary | ICD-10-CM | POA: Insufficient documentation

## 2014-10-14 DIAGNOSIS — C9101 Acute lymphoblastic leukemia, in remission: Secondary | ICD-10-CM

## 2014-10-14 DIAGNOSIS — Z0000 Encounter for general adult medical examination without abnormal findings: Secondary | ICD-10-CM

## 2014-10-14 DIAGNOSIS — M2550 Pain in unspecified joint: Secondary | ICD-10-CM

## 2014-10-14 LAB — CHCC SATELLITE - SMEAR

## 2014-10-14 LAB — URINALYSIS, ROUTINE W REFLEX MICROSCOPIC
BILIRUBIN URINE: NEGATIVE
KETONES UR: NEGATIVE
Nitrite: NEGATIVE
SPECIFIC GRAVITY, URINE: 1.025 (ref 1.000–1.030)
Total Protein, Urine: NEGATIVE
Urine Glucose: NEGATIVE
Urobilinogen, UA: 0.2 (ref 0.0–1.0)
pH: 6.5 (ref 5.0–8.0)

## 2014-10-14 LAB — CBC WITH DIFFERENTIAL (CANCER CENTER ONLY)
BASO#: 0 10*3/uL (ref 0.0–0.2)
BASO%: 0.3 % (ref 0.0–2.0)
EOS%: 1.4 % (ref 0.0–7.0)
Eosinophils Absolute: 0.1 10*3/uL (ref 0.0–0.5)
HEMATOCRIT: 41.7 % (ref 34.8–46.6)
HGB: 14.3 g/dL (ref 11.6–15.9)
LYMPH#: 2.5 10*3/uL (ref 0.9–3.3)
LYMPH%: 31.3 % (ref 14.0–48.0)
MCH: 30.3 pg (ref 26.0–34.0)
MCHC: 34.3 g/dL (ref 32.0–36.0)
MCV: 88 fL (ref 81–101)
MONO#: 0.5 10*3/uL (ref 0.1–0.9)
MONO%: 5.7 % (ref 0.0–13.0)
NEUT%: 61.3 % (ref 39.6–80.0)
NEUTROS ABS: 4.9 10*3/uL (ref 1.5–6.5)
Platelets: 285 10*3/uL (ref 145–400)
RBC: 4.72 10*6/uL (ref 3.70–5.32)
RDW: 12.6 % (ref 11.1–15.7)
WBC: 7.9 10*3/uL (ref 3.9–10.0)

## 2014-10-14 LAB — CBC WITH DIFFERENTIAL/PLATELET
BASOS ABS: 0 10*3/uL (ref 0.0–0.1)
BASOS PCT: 0.5 % (ref 0.0–3.0)
EOS ABS: 0.1 10*3/uL (ref 0.0–0.7)
Eosinophils Relative: 1.8 % (ref 0.0–5.0)
HEMATOCRIT: 40.3 % (ref 36.0–46.0)
Hemoglobin: 14 g/dL (ref 12.0–15.0)
Lymphocytes Relative: 41.4 % (ref 12.0–46.0)
Lymphs Abs: 2.9 10*3/uL (ref 0.7–4.0)
MCHC: 34.7 g/dL (ref 30.0–36.0)
MCV: 86.3 fl (ref 78.0–100.0)
MONO ABS: 0.4 10*3/uL (ref 0.1–1.0)
Monocytes Relative: 5.2 % (ref 3.0–12.0)
NEUTROS ABS: 3.6 10*3/uL (ref 1.4–7.7)
NEUTROS PCT: 51.1 % (ref 43.0–77.0)
PLATELETS: 287 10*3/uL (ref 150.0–400.0)
RBC: 4.67 Mil/uL (ref 3.87–5.11)
RDW: 12.6 % (ref 11.5–15.5)
WBC: 7.1 10*3/uL (ref 4.0–10.5)

## 2014-10-14 LAB — HEPATIC FUNCTION PANEL
ALK PHOS: 44 U/L (ref 39–117)
ALT: 25 U/L (ref 0–35)
AST: 21 U/L (ref 0–37)
Albumin: 3.9 g/dL (ref 3.5–5.2)
BILIRUBIN TOTAL: 0.6 mg/dL (ref 0.2–1.2)
Bilirubin, Direct: 0.1 mg/dL (ref 0.0–0.3)
TOTAL PROTEIN: 6.7 g/dL (ref 6.0–8.3)

## 2014-10-14 LAB — RHEUMATOID FACTOR: Rhuematoid fact SerPl-aCnc: <10 IU/mL (ref <=14)

## 2014-10-14 LAB — SEDIMENTATION RATE: Sed Rate: 9 mm/hr (ref 0–22)

## 2014-10-14 LAB — IRON AND TIBC CHCC
%SAT: 34 % (ref 21–57)
Iron: 149 ug/dL — ABNORMAL HIGH (ref 41–142)
TIBC: 436 ug/dL (ref 236–444)
UIBC: 287 ug/dL (ref 120–384)

## 2014-10-14 LAB — BASIC METABOLIC PANEL
BUN: 11 mg/dL (ref 6–23)
CHLORIDE: 106 meq/L (ref 96–112)
CO2: 24 mEq/L (ref 19–32)
CREATININE: 0.68 mg/dL (ref 0.40–1.20)
Calcium: 8.9 mg/dL (ref 8.4–10.5)
GFR: 107.67 mL/min (ref >60.00)
GLUCOSE: 92 mg/dL (ref 70–99)
Potassium: 3.8 mEq/L (ref 3.5–5.1)
SODIUM: 139 meq/L (ref 135–145)

## 2014-10-14 LAB — TSH: TSH: 1.36 u[IU]/mL (ref 0.35–4.50)

## 2014-10-14 LAB — FERRITIN CHCC: Ferritin: 51 ng/ml (ref 9–269)

## 2014-10-14 MED ORDER — LEVONORGEST-ETH ESTRAD 91-DAY 0.15-0.03 &0.01 MG PO TABS: 1.0000 | ORAL_TABLET | Freq: Every day | ORAL | Status: DC

## 2014-10-14 MED ORDER — METOPROLOL TARTRATE 25 MG PO TABS: ORAL_TABLET | ORAL | Status: DC

## 2014-10-14 MED ORDER — SUMATRIPTAN SUCCINATE 50 MG PO TABS: ORAL_TABLET | ORAL | Status: DC

## 2014-10-14 MED ORDER — BETAMETHASONE DIPROPIONATE 0.05 % EX CREA: TOPICAL_CREAM | Freq: Two times a day (BID) | CUTANEOUS | Status: AC

## 2014-10-14 NOTE — Assessment & Plan Note (Signed)
Will obtain ANA, RA, ESR to screen for autoimmune etiology. e

## 2014-10-14 NOTE — Assessment & Plan Note (Signed)
Discussed gerd diet with pt.

## 2014-10-14 NOTE — Patient Instructions (Addendum)
Schedule a mole removal at the front desk. Complete lab work prior to leaving. Schedule dental exam at your earliest convenience. Continue your work on healthy diet, exercise and weight loss.   Gastroesophageal Reflux Disease, Adult Gastroesophageal reflux disease (GERD) happens when acid from your stomach goes into your food pipe (esophagus). The acid can cause a burning feeling in your chest. Over time, the acid can make small holes (ulcers) in your food pipe.  HOME CARE  Ask your doctor for advice about:  Losing weight.  Quitting smoking.  Alcohol use.  Avoid foods and drinks that make your problems worse. You may want to avoid:  Caffeine and alcohol.  Chocolate.  Mints.  Garlic and onions.  Spicy foods.  Citrus fruits, such as oranges, lemons, or limes.  Foods that contain tomato, such as sauce, chili, salsa, and pizza.  Fried and fatty foods.  Avoid lying down for 3 hours before you go to bed or before you take a nap.  Eat small meals often, instead of large meals.  Wear loose-fitting clothing. Do not wear anything tight around your waist.  Raise (elevate) the head of your bed 6 to 8 inches with wood blocks. Using extra pillows does not help.  Only take medicines as told by your doctor.  Do not take aspirin or ibuprofen. GET HELP RIGHT AWAY IF:   You have pain in your arms, neck, jaw, teeth, or back.  Your pain gets worse or changes.  You feel sick to your stomach (nauseous), throw up (vomit), or sweat (diaphoresis).  You feel short of breath, or you pass out (faint).  Your throw up is green, yellow, black, or looks like coffee grounds or blood.  Your poop (stool) is red, bloody, or black. MAKE SURE YOU:   Understand these instructions.  Will watch your condition.  Will get help right away if you are not doing well or get worse. Document Released: 10/09/2007 Document Revised: 07/15/2011 Document Reviewed: 11/09/2010 [facility] Patient  Information 2015 [place], [place]. This information is not intended to replace advice given to you by your health care provider. Make sure you discuss any questions you have with your health care provider.

## 2014-10-14 NOTE — Progress Notes (Signed)
Hematology and Oncology Follow Up Visit  Rita Adkins [id number] 29-Mar-1986 29 y.o. 10/14/2014   Principle Diagnosis:   History of childhood acute lymphoblastic leukemia  Current Therapy:    Observation     Interim History:  Ms.  Adkins is is back for her yearly follow-up. Her problem right now that she is having radicular pain down her right leg. This is worse when she sits. Patient had MRI done a few weeks ago. This really was unremarkable. Patient has numbness in the lateral aspect of her right thigh. Also is numbness in the right foot. There is no weakness. There is no issues with bowels or bladder.  She apparently will see a neurologist next month.  Otherwise, she seems to be doing okay. She's had no issues with fever. She's had no infections. She's had no nausea or vomiting. She's had no bleeding. She is on oral contraceptives to help with her cycles.  Her kids are doing okay. Have her husband has a new job. Because of this, they will be going to go on vacation this summer.  Overall, her performance status is ECOG 0. Medications:  Current outpatient prescriptions:  .  aspirin-acetaminophen-caffeine (EXCEDRIN MIGRAINE) 250-250-65 MG per tablet, Takes 1 tablet every 2 days as needed for headache., Disp: , Rfl:  .  betamethasone dipropionate (DIPROLENE) 0.05 % cream, Apply topically 2 (two) times daily., Disp: 30 g, Rfl: 1 .  Fish Oil-Cholecalciferol (FISH OIL + D3 PO), Take by mouth every morning., Disp: , Rfl:  .  Ginkgo Biloba 100 MG CAPS, Take by mouth every morning., Disp: , Rfl:  .  Levonorgestrel-Ethinyl Estradiol (AMETHIA,CAMRESE) 0.15-0.03 &0.01 MG tablet, Take 1 tablet by mouth daily., Disp: 1 Package, Rfl: 3 .  metoprolol tartrate (LOPRESSOR) 25 MG tablet, TAKE 1/2 TABLET BY MOUTH TWICE DAILY, Disp: 90 tablet, Rfl: 1 .  SUMAtriptan (IMITREX) 50 MG tablet, TAKE 1 TABLET BY MOUTH AT THE START OF MIGRAINE. MAY REPEAT IN 2 HOURS AS NEEDED. MAXIMUM OF 2 TABLETS PER 24 HOURS,  Disp: 30 tablet, Rfl: 1 .  Vitamin D, Cholecalciferol, 1000 UNITS TABS, Take by mouth every morning., Disp: , Rfl:   Allergies: No Known Allergies  Past Medical History, Surgical history, Social history, and Family History were reviewed and updated.  Review of Systems: As above  Physical Exam:  height is 5\' 5"  (1.651 m) and weight is 166 lb (75.297 kg). Her oral temperature is 98.1 F (36.7 C). Her blood pressure is 177/78 and her pulse is 83. Her respiration is 14.   Well-developed and well-nourished white female. Vital signs are temperature of 98.2. Pulse 70. Blood pressure 120 or 76. Weight is 163 pounds. Head and neck exam shows no ocular or oral lesions. There are no palpable cervical or supraclavicular lymph nodes. Lungs are clear. Cardiac exam regular rate and rhythm with no murmurs rubs or bruits. Abdomen is soft. Has good bowel sounds. No fluid wave. There is no palpable liver or spleen tip. Back exam no tenderness over the spine ribs or hips. Extremities shows no clubbing cyanosis or edema. I would say that she has a positive straight leg raise on the right side. She has no weakness in her legs. Skin exam no rashes, ecchymoses or petechia. Neurological exam is nonfocal.  Lab Results  Component Value Date   WBC 7.9 10/14/2014   HGB 14.3 10/14/2014   HCT 41.7 10/14/2014   MCV 88 10/14/2014   PLT 285 10/14/2014     Chemistry  Component Value Date/Time   NA 139 10/15/2013 1010   NA 140 10/14/2012 1519   K 3.8 10/15/2013 1010   K 3.7 10/14/2012 1519   CL 105 10/15/2013 1010   CL 104 10/14/2012 1519   CO2 27 10/15/2013 1010   CO2 27 10/14/2012 1519   BUN 11 10/15/2013 1010   BUN 15 10/14/2012 1519   CREATININE 0.7 10/15/2013 1010   CREATININE 0.82 10/14/2012 1519      Component Value Date/Time   CALCIUM 8.8 10/15/2013 1010   CALCIUM 9.3 10/14/2012 1519   ALKPHOS 37 10/15/2013 1010   ALKPHOS 57 10/14/2012 1519   AST 23 10/15/2013 1010   AST 16 10/14/2012 1519    ALT 31 10/15/2013 1010   ALT 10 10/14/2012 1519   BILITOT 0.50 10/15/2013 1010   BILITOT 0.5 10/14/2012 1519         Impression and Plan: Rita Adkins is 29 year old female with a history of childhood lymphoblastic leukemia. This obviously is cured. I don't see any complications so far from her treatments.  She would be at risk for second malignancies. I chew better that she is having any problems with her right leg. The MRI, which I looked I, certainly did not show anything obvious. However, she does have symptoms.  She sees a neurologist next month. I don't know if she will need a myelogram.  Any workup that needs to be done will be okay from my point of view.   We will go and plan to get her back for one more year.  Volanda Napoleon, MD 6/10/201610:38 AM

## 2014-10-14 NOTE — Progress Notes (Signed)
Subjective:    Patient ID: Rita Adkins, female    DOB: [birth year], 29 y.o.   MRN: [id number]  HPI  Rita Adkins is a 29 yr old female who presents today for cpx.  Last CPX was 12/27/13.  She wishes to complete today and does not wish to wait until august.  Patient presents today for complete physical.  Immunizations: tetanus 6/14 Diet: reports diet is overall good, trying to eat breakfast Exercise: started doing yoga, helping some with her back pain Pap Smear:  6/14 Vision:  Up to date Dental- not up to date  Wt Readings from Last 3 Encounters:  10/14/14 167 lb 3.2 oz (75.841 kg)  09/19/14 163 lb 3.2 oz (74.027 kg)  07/19/14 163 lb (73.936 kg)   Review of Systems  Constitutional: Negative for unexpected weight change.  HENT: Negative for hearing loss and rhinorrhea.   Eyes: Negative for visual disturbance.  Respiratory: Negative for cough and shortness of breath.   Cardiovascular: Negative for leg swelling.  Gastrointestinal: Negative for nausea, diarrhea and constipation.       Some gerd symptoms- uses pepcid prn  Genitourinary: Negative for dysuria and frequency.  Musculoskeletal: Positive for back pain. Negative for myalgias.       Reports bilateral wrist/ankle pain  Skin: Negative for rash.  Neurological:       Rare headaches  Hematological: Negative for adenopathy.  Psychiatric/Behavioral: Negative for dysphoric mood and agitation.   Past Medical History  Diagnosis Date  . 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 History of HPV infection     History   Social History  . Marital Status: Married    Spouse Name: N/A  . Number of Children: 2  . Years of Education: N/A   Occupational History  .     Social History Main Topics  . Smoking status: Never Smoker   . Smokeless tobacco: Never Used  . Alcohol Use: 1.2 oz/week    2 Standard drinks or equivalent per week  . Drug Use: Not on file  . Sexual Activity: Not on file   Other Topics Concern  . 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 Port-a-cath removal  1997 or 1999?  Marland Kitchen Iud removal  2008    "IUD penetrated the abdomen and had to be surgically removed"    Family History  Problem Relation Age of Onset  . COPD Mother   . Hypertension Father   . Hyperlipidemia Father   . Cancer Paternal Uncle     lung  . Diabetes Maternal Grandmother   . Cancer Paternal Grandfather     brain  . Heart disease Neg Hx   . Stroke Neg Hx     No Known Allergies  Current Outpatient Prescriptions on File Prior to Visit  Medication Sig Dispense Refill  . aspirin-acetaminophen-caffeine (EXCEDRIN MIGRAINE) 250-250-65 MG per tablet Takes 1 tablet every 2 days as needed for headache.    . Fish Oil-Cholecalciferol (FISH OIL + D3 PO) Take by mouth every morning.    . Ginkgo Biloba 100 MG CAPS Take by mouth every morning.    . Vitamin D, Cholecalciferol, 1000 UNITS TABS Take by mouth  every morning.     No current facility-administered medications on file prior to visit.    BP 102/70 mmHg  Pulse 78  Temp(Src) 97.9 F (36.6 C) (Oral)  Resp 16  Ht 5' 5.5" (1.664 m)  Wt 167 lb 3.2 oz (75.841 kg)  BMI 27.39 kg/m2  SpO2 99%  LMP 08/01/2014       Objective:   Physical Exam   Physical Exam  Constitutional: She is oriented to person, place, and time. She appears well-developed and well-nourished. No distress.  HENT:  Head: Normocephalic and atraumatic.  Right Ear: Tympanic membrane and ear canal normal.  Left Ear: Tympanic membrane and ear canal normal.  Mouth/Throat: Oropharynx is clear and moist.  Eyes: Pupils are equal, round, and reactive to light. No scleral icterus.  Neck: Normal range of motion. No thyromegaly present.  Cardiovascular: Normal rate and regular rhythm.     No murmur heard. Pulmonary/Chest: Effort normal and breath sounds normal. No respiratory distress. He has no wheezes. She has no rales. She exhibits no tenderness.  Abdominal: Soft. Bowel sounds are normal. He exhibits no distension and no mass. There is no tenderness. There is no rebound and no guarding.  Musculoskeletal: She exhibits no edema.  Lymphadenopathy:    She has no cervical adenopathy.  Neurological: She is alert and oriented to person, place, and time. She has normal patellar reflexes. She exhibits normal muscle tone. Coordination normal.  Skin: Skin is warm and dry.  eczematous peeling skin noted bilateral hands/fingers.  Irregularly discolored mole noted on back between scapula Psychiatric: She has a normal mood and affect. Her behavior is normal. Judgment and thought content normal.  Breasts: Examined lying Right: Without masses, retractions, discharge or axillary adenopathy.  Left: Without masses, retractions, discharge or axillary adenopathy.         Assessment & Plan:        Assessment & Plan:

## 2014-10-14 NOTE — Assessment & Plan Note (Signed)
Discussed diet/exercise, weight loss.  Goal weight around 150. Immunizations reviewed and up to date.

## 2014-10-14 NOTE — Assessment & Plan Note (Signed)
Trial of diprolene cream.

## 2014-10-15 LAB — LACTATE DEHYDROGENASE: LDH: 151 U/L (ref 94–250)

## 2014-10-15 LAB — VITAMIN D 25 HYDROXY (VIT D DEFICIENCY, FRACTURES): VIT D 25 HYDROXY: 37 ng/mL (ref 30–100)

## 2014-10-17 ENCOUNTER — Encounter: Payer: Self-pay | Admitting: *Deleted

## 2014-10-17 LAB — ANA: ANA: NEGATIVE

## 2014-10-27 ENCOUNTER — Encounter: Payer: Self-pay | Admitting: Family

## 2014-10-27 ENCOUNTER — Telehealth: Payer: Self-pay | Admitting: *Deleted

## 2014-10-27 ENCOUNTER — Ambulatory Visit (INDEPENDENT_AMBULATORY_CARE_PROVIDER_SITE_OTHER): Payer: 59 | Admitting: Family

## 2014-10-27 VITALS — BP 100/70 | HR 76 | Temp 98.2°F | Resp 18 | Ht 65.5 in | Wt 166.6 lb

## 2014-10-27 DIAGNOSIS — D225 Melanocytic nevi of trunk: Secondary | ICD-10-CM

## 2014-10-27 DIAGNOSIS — D229 Melanocytic nevi, unspecified: Secondary | ICD-10-CM | POA: Insufficient documentation

## 2014-10-27 NOTE — Telephone Encounter (Signed)
-----   Message from Rosalita Chessman, DO sent at 10/18/2014  9:46 PM EDT ----- Labs all are normal except urine.  Any urinary symptoms?  If yes we need to check a culture.

## 2014-10-27 NOTE — Assessment & Plan Note (Signed)
Procedure including risks/benefits explained to patient.  Questions were answered. After informed consent was obtained and a time out completed, site was cleansed with betadine and then alcohol. 1% Lidocaine with epinephrine was injected under lesion and then shave biopsy was performed. Area was cauterized to obtain hemostasis.  Pt tolerated procedure well.  Specimen sent for pathology review.  Pt instructed to keep the area dry for 24 hours and to contact us if she develops redness, drainage or swelling at the site.

## 2014-10-27 NOTE — Patient Instructions (Signed)
Keep area dry x 24 hrs.  Call if redness/swelling/drainage occurs. We will mail you biopsy results in the next 2 weeks. Call if you have not heard from [place] in 2 weeks.

## 2014-10-27 NOTE — Telephone Encounter (Signed)
  Mailed letter to pt.  Notes Recorded by Ronny Flurry, CMA on 10/19/2014 at 7:42 AM Left detailed message on home # and to return my call.

## 2014-10-27 NOTE — Progress Notes (Signed)
   Subjective:    Patient ID: Rita Adkins, female    DOB: 13-Mar-1986, 29 y.o.   MRN: [id number]  HPI  Pt presents today for mole removal.  Mole is located on her back.   Review of Systems     Objective:   Physical Exam  Constitutional: She is oriented to person, place, and time. She appears well-developed and well-nourished. No distress.  Neurological: She is alert and oriented to person, place, and time.  Skin:  approx 3 mm wide dark/irregularly pigmented lesion between scapula  Psychiatric: She has a normal mood and affect. Her behavior is normal. Judgment and thought content normal.          Assessment & Plan:

## 2014-11-02 ENCOUNTER — Encounter: Payer: 59 | Admitting: Family

## 2014-11-02 ENCOUNTER — Encounter: Payer: Self-pay | Admitting: Family

## 2014-11-11 ENCOUNTER — Ambulatory Visit: Payer: 59 | Admitting: Neurology

## 2014-11-16 ENCOUNTER — Ambulatory Visit: Payer: 59 | Admitting: Neurology

## 2014-11-16 ENCOUNTER — Encounter: Payer: Self-pay | Admitting: Family

## 2014-12-05 ENCOUNTER — Encounter (HOSPITAL_BASED_OUTPATIENT_CLINIC_OR_DEPARTMENT_OTHER): Payer: Self-pay | Admitting: *Deleted

## 2014-12-05 ENCOUNTER — Emergency Department (HOSPITAL_BASED_OUTPATIENT_CLINIC_OR_DEPARTMENT_OTHER)
Admission: EM | Admit: 2014-12-05 | Discharge: 2014-12-06 | Disposition: A | Discharge status: Home / Self Care | Payer: 59 | Attending: Emergency Medicine | Admitting: Emergency Medicine

## 2014-12-05 DIAGNOSIS — K029 Dental caries, unspecified: Secondary | ICD-10-CM

## 2014-12-05 DIAGNOSIS — Z8619 Personal history of other infectious and parasitic diseases: Secondary | ICD-10-CM | POA: Diagnosis not present

## 2014-12-05 DIAGNOSIS — Z856 Personal history of leukemia: Secondary | ICD-10-CM | POA: Insufficient documentation

## 2014-12-05 DIAGNOSIS — Z79899 Other long term (current) drug therapy: Secondary | ICD-10-CM | POA: Diagnosis not present

## 2014-12-05 DIAGNOSIS — K088 Other specified disorders of teeth and supporting structures: Secondary | ICD-10-CM | POA: Diagnosis present

## 2014-12-05 DIAGNOSIS — Z793 Long term (current) use of hormonal contraceptives: Secondary | ICD-10-CM | POA: Diagnosis not present

## 2014-12-05 NOTE — ED Notes (Signed)
Pt. Reports she chipped her tooth last week but tonight the pain got really bad and she cant stand the pain.  Pt. Does not have a dentist.

## 2014-12-06 MED ORDER — HYDROCODONE-ACETAMINOPHEN 5-325 MG PO TABS: 1.0000 | ORAL_TABLET | Freq: Four times a day (QID) | ORAL | Status: DC | PRN

## 2014-12-06 MED ORDER — PENICILLIN V POTASSIUM 250 MG PO TABS: 250.0000 mg | ORAL_TABLET | Freq: Four times a day (QID) | ORAL | Status: AC

## 2014-12-06 NOTE — Discharge Instructions (Signed)
Dental Caries °Dental caries is tooth decay. This decay can cause a hole in teeth (cavity) that can get bigger and deeper over time. °HOME CARE °· Brush and floss your teeth. Do this at least two times a day. °· Use a fluoride toothpaste. °· Use a mouth rinse if told by your dentist or doctor. °· Eat less sugary and starchy foods. Drink less sugary drinks. °· Avoid snacking often on sugary and starchy foods. Avoid sipping often on sugary drinks. °· Keep regular checkups and cleanings with your dentist. °· Use fluoride supplements if told by your dentist or doctor. °· Allow fluoride to be applied to teeth if told by your dentist or doctor. °Document Released: 01/30/2008 Document Revised: 09/06/2013 Document Reviewed: 04/24/2012 °ExitCare® Patient Information ©2015 [employer]. This information is not intended to replace advice given to you by your health care provider. Make sure you discuss any questions you have with your health care provider. ° °Dental Pain °Toothache is pain in or around a tooth. It may get worse with chewing or with cold or heat.  °HOME CARE °· Your dentist may use a numbing medicine during treatment. If so, you may need to avoid eating until the medicine wears off. Ask your dentist about this. °· Only take medicine as told by your dentist or doctor. °· Avoid chewing food near the painful tooth until after all treatment is done. Ask your dentist about this. °GET HELP RIGHT AWAY IF:  °· The problem gets worse or new problems appear. °· You have a fever. °· There is redness and puffiness (swelling) of the face, jaw, or neck. °· You cannot open your mouth. °· There is pain in the jaw. °· There is very bad pain that is not helped by medicine. °MAKE SURE YOU:  °· Understand these instructions. °· Will watch your condition. °· Will get help right away if you are not doing well or get worse. °Document Released: 10/09/2007 Document Revised: 07/15/2011 Document Reviewed: 10/09/2007 °ExitCare® Patient  Information ©2015 [employer]. This information is not intended to replace advice given to you by your health care provider. Make sure you discuss any questions you have with your health care provider. ° °

## 2014-12-06 NOTE — ED Provider Notes (Signed)
CSN: [id number]     Arrival date & time 12/05/14  2310 History  This chart was scribed for Rita Dessert, MD by Rita Adkins, ED Scribe. This patient was seen in room MH02/MH02 and the patient's care was started 12:07 AM.   Chief Complaint  Patient presents with  . Dental Pain   The history is provided by the patient. No language interpreter was used.   HPI Comments: Rita Adkins is a 29 y.o. female who presents to the Emergency Department complaining of progressively worsening, constant, moderate, upper, posterior dental pain s/p dental fracture that occurred several days ago. Pt notes the pain progressively worsened today prompting ED evaluation. She reports associated swelling to the gum in her upper, posterior oral region. Pt attributes the dental fracture to a dental caries in the tooth. She has taken tylenol and Advil earlier today with mild to no relief. Pt is not followed by a dentist regularly. NKDA Denies fevers.   Past Medical History  Diagnosis Date  . 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 History of HPV infection    Past Surgical History  Procedure Laterality Date  . Port-a-cath removal  1997 or 1999?  Marland Kitchen Iud removal  2008    "IUD penetrated the abdomen and had to be surgically removed"   Family History  Problem Relation Age of Onset  . COPD Mother   . Hypertension Father   . Hyperlipidemia Father   . Cancer Paternal Uncle     lung  . Diabetes Maternal Grandmother   . Cancer Paternal Grandfather     brain  . Heart disease Neg Hx   . Stroke Neg Hx    History  Substance Use Topics  . Smoking status: Never Smoker   . Smokeless tobacco: Never Used     Comment: NEVER SMOKED  . Alcohol Use: 1.2 oz/week    2 Standard drinks or equivalent per week   OB History    No data available     Review of Systems  Constitutional: Negative for fever.  HENT: Positive for dental problem.   All other systems reviewed and are negative.  Allergies  Review of  patient's allergies indicates no known allergies.  Home Medications   Prior to Admission medications   Medication Sig Start Date End Date Taking? Authorizing Provider  aspirin-acetaminophen-caffeine (EXCEDRIN MIGRAINE) [phone] MG per tablet Takes 1 tablet every 2 days as needed for headache.    Historical Provider, MD  betamethasone dipropionate (DIPROLENE) 0.05 % cream Apply topically 2 (two) times daily. 10/14/14   Debbrah Alar, NP  Fish Oil-Cholecalciferol (FISH OIL + D3 PO) Take by mouth every morning.    Historical Provider, MD  Ginkgo Biloba 100 MG CAPS Take by mouth every morning.    Historical Provider, MD  Levonorgestrel-Ethinyl Estradiol (AMETHIA,CAMRESE) 0.15-0.03 &0.01 MG tablet Take 1 tablet by mouth daily. 10/14/14   Debbrah Alar, NP  metoprolol tartrate (LOPRESSOR) 25 MG tablet TAKE 1/2 TABLET BY MOUTH TWICE DAILY 10/14/14   Debbrah Alar, NP  SUMAtriptan (IMITREX) 50 MG tablet TAKE 1 TABLET BY MOUTH AT THE START OF MIGRAINE. MAY REPEAT IN 2 HOURS AS NEEDED. MAXIMUM OF 2 TABLETS PER 24 HOURS 10/14/14   Debbrah Alar, NP  Vitamin D, Cholecalciferol, 1000 UNITS TABS Take by mouth every morning.    Historical Provider, MD   BP 130/87 mmHg  Pulse 96  Temp(Src) 98.1 F (36.7 C) (Oral)  Resp 18  Ht 5\' 6"  (1.676  m)  Wt 160 lb (72.576 kg)  BMI 25.84 kg/m2  SpO2 100%  LMP 10/25/2014 Physical Exam  Constitutional: She appears well-developed and well-nourished. No distress.  HENT:  Head: Normocephalic.  Mouth/Throat: Uvula is midline, oropharynx is clear and moist and mucous membranes are normal.    Eyes: Conjunctivae are normal.  Neck: No JVD present.  Pulmonary/Chest: Effort normal. No respiratory distress.  Neurological: She is alert. Coordination normal.  Skin: Skin is warm. No rash noted. No erythema. No pallor.  Psychiatric: She has a normal mood and affect. Her behavior is normal.  Nursing note and vitals reviewed.   ED Course  Procedures   DIAGNOSTIC STUDIES: Oxygen Saturation is 100% on RA, normal by my interpretation.    COORDINATION OF CARE: 12:12 AM Discussed treatment plan which includes to prescribe penicillin and pain medication with pt Will give dental referral. Pt acknowledges and agrees to plan.   Labs Review Labs Reviewed - No data to display  Imaging Review No results found.   EKG Interpretation None      MDM   Final diagnoses:  Dental caries    Pt with dental caries and no facial swelling.  No signs of ludwig's angina or difficulty swallowing and no systemic symptoms. Will treat with PCN and have pt f/u with dentist.  I personally performed the services described in this documentation, which was scribed in my presence.  The recorded information has been reviewed and considered.    Rita Dessert, MD 12/06/14 [phone]

## 2015-02-01 ENCOUNTER — Encounter: Payer: Self-pay | Admitting: Family

## 2015-02-01 ENCOUNTER — Ambulatory Visit (INDEPENDENT_AMBULATORY_CARE_PROVIDER_SITE_OTHER): Payer: Self-pay | Admitting: Family

## 2015-02-01 VITALS — BP 123/76 | HR 80 | Temp 98.5°F | Resp 18 | Ht 65.5 in | Wt 164.8 lb

## 2015-02-01 DIAGNOSIS — F419 Anxiety disorder, unspecified: Principal | ICD-10-CM

## 2015-02-01 DIAGNOSIS — F418 Other specified anxiety disorders: Secondary | ICD-10-CM

## 2015-02-01 DIAGNOSIS — F329 Major depressive disorder, single episode, unspecified: Secondary | ICD-10-CM

## 2015-02-01 DIAGNOSIS — F32A Depression, unspecified: Secondary | ICD-10-CM

## 2015-02-01 DIAGNOSIS — Z23 Encounter for immunization: Secondary | ICD-10-CM

## 2015-02-01 MED ORDER — ESCITALOPRAM OXALATE 10 MG PO TABS: ORAL_TABLET | ORAL | Status: DC

## 2015-02-01 NOTE — Patient Instructions (Signed)
Start lexapro 1/2 tab once daily for 1 week, increase to a full tab once daily on week two.

## 2015-02-01 NOTE — Progress Notes (Signed)
Subjective:    Patient ID: Rita Adkins, female    DOB: 12/09/1985, 29 y.o.   MRN: [id number]  HPI  Rita Adkins is a 29 yr old female who presents today to discuss anxiety and depression.  Works for [employer], learned that she will be losing her job.  Reports hx of "bouts of depression."  Reports more frequently she has found herself more tearful, unmotivated to do the things that ususualy enjoys.  Reports that she gets nervous in large crowds.  Reports that she has chronic insomnia.  Insomnia is unchanged. She is a light sleeper.  Reports that she has been very tearful the last 2 weeks following the death of her cat. She witnessed the cat getting hit by a car.   Reports chronic pain, but her kids keep her going. Reports she feels hopeless.  Notes that she has never had thought of hurting herself or had any plan, however she sometimes feels like, "if I didn't wake up in the morning it wouldn't be the worst thing."    Review of Systems    see HPI  Past Medical History  Diagnosis Date  . 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 History of HPV infection     Social History   Social History  . Marital Status: Married    Spouse Name: N/A  . Number of Children: 2  . Years of Education: N/A   Occupational History  .     Social History Main Topics  . Smoking status: Never Smoker   . Smokeless tobacco: Never Used     Comment: NEVER SMOKED  . Alcohol Use: 1.2 oz/week    2 Standard drinks or equivalent per week  . Drug Use: Not on file  . Sexual Activity: Not on file   Other Topics Concern  . Not on file   Social History Narrative   Married   Has a 52 yr old daughter and 63 yr old son   Works as Educational psychologist- Surveyor, minerals and serves [place]   Enjoys gardening   [employer] business   Completed certificate for Print production planner    Past Surgical History  Procedure Laterality Date  . Port-a-cath removal  1997 or 1999?  Marland Kitchen Iud removal  2008    "IUD penetrated the abdomen and  had to be surgically removed"    Family History  Problem Relation Age of Onset  . COPD Mother   . Hypertension Father   . Hyperlipidemia Father   . Cancer Paternal Uncle     lung  . Diabetes Maternal Grandmother   . Cancer Paternal Grandfather     brain  . Heart disease Neg Hx   . Stroke Neg Hx     No Known Allergies  Current Outpatient Prescriptions on File Prior to Visit  Medication Sig Dispense Refill  . aspirin-acetaminophen-caffeine (EXCEDRIN MIGRAINE) 250-250-65 MG per tablet Takes 1 tablet every 2 days as needed for headache.    . betamethasone dipropionate (DIPROLENE) 0.05 % cream Apply topically 2 (two) times daily. 30 g 1  . Fish Oil-Cholecalciferol (FISH OIL + D3 PO) Take by mouth every morning.    . Ginkgo Biloba 100 MG CAPS Take by mouth every morning.    . Levonorgestrel-Ethinyl Estradiol (AMETHIA,CAMRESE) 0.15-0.03 &0.01 MG tablet Take 1 tablet by mouth daily. 1 Package 3  . metoprolol tartrate (LOPRESSOR) 25 MG tablet TAKE 1/2 TABLET BY MOUTH TWICE DAILY 90 tablet 1  . SUMAtriptan (IMITREX)  50 MG tablet TAKE 1 TABLET BY MOUTH AT THE START OF MIGRAINE. MAY REPEAT IN 2 HOURS AS NEEDED. MAXIMUM OF 2 TABLETS PER 24 HOURS 30 tablet 1  . Vitamin D, Cholecalciferol, 1000 UNITS TABS Take by mouth every morning.     No current facility-administered medications on file prior to visit.    BP 123/76 mmHg  Pulse 80  Temp(Src) 98.5 F (36.9 C) (Oral)  Resp 18  Ht 5' 5.5" (1.664 m)  Wt 164 lb 12.8 oz (74.753 kg)  BMI 27.00 kg/m2  SpO2 100%  LMP 11/01/2014    Objective:   Physical Exam  Constitutional: She appears well-developed and well-nourished. No distress.  Psychiatric: Her behavior is normal. Judgment and thought content normal.  Flat affect, briefly tearful upon discussion of her cat          Assessment & Plan:  15 minutes spent with pt today. >50% of this time was spent counseling pt on anxiety/depression and treatment. She understands that if she  every has active SI with plan she is to go directly to the ED.   Flu shot today.

## 2015-02-01 NOTE — Assessment & Plan Note (Signed)
Deteriorated. Recommended that she start lexapro 10mg . I instructed pt to start 1/2 tablet once daily for 1 week and then increase to a full tablet once daily on week two as tolerated.  We discussed common side effects such as nausea, drowsiness and weight gain.  Also discussed rare but serious side effect of suicide ideation.  She is instructed to discontinue medication go directly to ED if this occurs.  Pt verbalizes understanding.    Also given info to schedule with a counselor as well.  Plan follow up in 1 month to evaluate progress.

## 2015-02-01 NOTE — Progress Notes (Signed)
Pre visit review using our clinic review tool, if applicable. No additional management support is needed unless otherwise documented below in the visit note. 

## 2015-03-06 ENCOUNTER — Ambulatory Visit: Payer: Self-pay | Admitting: Family

## 2015-03-06 ENCOUNTER — Telehealth: Payer: Self-pay | Admitting: Family

## 2015-03-06 DIAGNOSIS — Z0289 Encounter for other administrative examinations: Secondary | ICD-10-CM

## 2015-03-10 NOTE — Telephone Encounter (Signed)
Charge please 

## 2015-03-10 NOTE — Telephone Encounter (Signed)
Pt was no show 03/06/15 1:15pm for follow up appt, pt has not rescheduled this appt but is scheduled to come in December, charge or no charge?

## 2015-04-26 ENCOUNTER — Ambulatory Visit: Payer: Self-pay | Admitting: Family

## 2015-04-27 ENCOUNTER — Telehealth: Payer: Self-pay | Admitting: Family

## 2015-04-27 NOTE — Telephone Encounter (Signed)
Pt was no show 04/26/15 11:00am for follow up appt, 3rd no show since March, pt did not reschedule, charge or no charge?

## 2015-04-27 NOTE — Telephone Encounter (Signed)
Yes please

## 2015-05-08 ENCOUNTER — Encounter: Payer: Self-pay | Admitting: Family

## 2015-05-16 ENCOUNTER — Telehealth: Payer: Self-pay | Admitting: Family

## 2015-05-16 NOTE — Telephone Encounter (Signed)
Patient dismissed from [facility] by Debbrah Alar , effective May 08, 2015. Dismissal letter sent out by certified / registered mail.  DAJ  Received signed domestic return receipt verifying delivery of certified letter on May 19, 2015. Article number [id number] [place]

## 2015-10-13 ENCOUNTER — Ambulatory Visit: Payer: 59 | Admitting: Hematology & Oncology

## 2015-10-13 ENCOUNTER — Other Ambulatory Visit: Payer: Self-pay

## 2015-10-20 ENCOUNTER — Other Ambulatory Visit: Payer: Self-pay | Admitting: Family

## 2015-10-26 ENCOUNTER — Other Ambulatory Visit: Payer: Self-pay | Admitting: *Deleted

## 2015-10-26 DIAGNOSIS — C9101 Acute lymphoblastic leukemia, in remission: Secondary | ICD-10-CM

## 2015-10-27 ENCOUNTER — Other Ambulatory Visit: Payer: Self-pay

## 2015-10-27 ENCOUNTER — Ambulatory Visit: Payer: Self-pay | Admitting: Hematology & Oncology

## 2015-11-09 ENCOUNTER — Other Ambulatory Visit (HOSPITAL_BASED_OUTPATIENT_CLINIC_OR_DEPARTMENT_OTHER): Payer: Self-pay

## 2015-11-09 ENCOUNTER — Ambulatory Visit (HOSPITAL_BASED_OUTPATIENT_CLINIC_OR_DEPARTMENT_OTHER): Payer: Self-pay | Admitting: Hematology & Oncology

## 2015-11-09 ENCOUNTER — Encounter: Payer: Self-pay | Admitting: Hematology & Oncology

## 2015-11-09 VITALS — BP 114/78 | HR 83 | Temp 97.5°F | Resp 18 | Ht 65.5 in | Wt 170.0 lb

## 2015-11-09 DIAGNOSIS — Z0000 Encounter for general adult medical examination without abnormal findings: Secondary | ICD-10-CM

## 2015-11-09 DIAGNOSIS — C9101 Acute lymphoblastic leukemia, in remission: Secondary | ICD-10-CM

## 2015-11-09 DIAGNOSIS — F418 Other specified anxiety disorders: Secondary | ICD-10-CM

## 2015-11-09 DIAGNOSIS — G43009 Migraine without aura, not intractable, without status migrainosus: Secondary | ICD-10-CM

## 2015-11-09 LAB — COMPREHENSIVE METABOLIC PANEL
ALT: 37 U/L (ref 0–55)
ANION GAP: 10 meq/L (ref 3–11)
AST: 21 U/L (ref 5–34)
Albumin: 3.7 g/dL (ref 3.5–5.0)
Alkaline Phosphatase: 73 U/L (ref 40–150)
BILIRUBIN TOTAL: 0.39 mg/dL (ref 0.20–1.20)
BUN: 7.6 mg/dL (ref 7.0–26.0)
CHLORIDE: 107 meq/L (ref 98–109)
CO2: 25 meq/L (ref 22–29)
CREATININE: 0.7 mg/dL (ref 0.6–1.1)
Calcium: 9.3 mg/dL (ref 8.4–10.4)
EGFR: >90 mL/min/{1.73_m2} (ref >90)
GLUCOSE: 86 mg/dL (ref 70–140)
Potassium: 3.7 mEq/L (ref 3.5–5.1)
Sodium: 142 mEq/L (ref 136–145)
TOTAL PROTEIN: 6.8 g/dL (ref 6.4–8.3)

## 2015-11-09 LAB — CBC WITH DIFFERENTIAL (CANCER CENTER ONLY)
BASO#: 0 10*3/uL (ref 0.0–0.2)
BASO%: 0.4 % (ref 0.0–2.0)
EOS%: 1.7 % (ref 0.0–7.0)
Eosinophils Absolute: 0.1 10*3/uL (ref 0.0–0.5)
HCT: 42.3 % (ref 34.8–46.6)
HEMOGLOBIN: 14.6 g/dL (ref 11.6–15.9)
LYMPH#: 3 10*3/uL (ref 0.9–3.3)
LYMPH%: 43.5 % (ref 14.0–48.0)
MCH: 30.1 pg (ref 26.0–34.0)
MCHC: 34.5 g/dL (ref 32.0–36.0)
MCV: 87 fL (ref 81–101)
MONO#: 0.4 10*3/uL (ref 0.1–0.9)
MONO%: 6 % (ref 0.0–13.0)
NEUT#: 3.4 10*3/uL (ref 1.5–6.5)
NEUT%: 48.4 % (ref 39.6–80.0)
PLATELETS: 277 10*3/uL (ref 145–400)
RBC: 4.85 10*6/uL (ref 3.70–5.32)
RDW: 12.4 % (ref 11.1–15.7)
WBC: 7 10*3/uL (ref 3.9–10.0)

## 2015-11-09 MED ORDER — ESCITALOPRAM OXALATE 10 MG PO TABS: ORAL_TABLET | ORAL | Status: DC

## 2015-11-09 MED ORDER — LEVONORGEST-ETH ESTRAD 91-DAY 0.15-0.03 &0.01 MG PO TABS: 1.0000 | ORAL_TABLET | Freq: Every day | ORAL | Status: DC

## 2015-11-09 MED ORDER — METOPROLOL TARTRATE 25 MG PO TABS: ORAL_TABLET | ORAL | Status: DC

## 2015-11-09 NOTE — Progress Notes (Signed)
Hematology and Oncology Follow Up Visit  IRENE MACBETH [id number] Dec 30, 1985 30 y.o. 11/09/2015   Principle Diagnosis:   History of childhood acute lymphoblastic leukemia  Current Therapy:    Observation     Interim History:  Ms.  Calvey is is back for her yearly follow-up. She still is having some discomfort in the back and right leg. She has seen other doctors for this. I don't think anything has been found that is concrete. She says that it seems to be doing better.  Unfortunately, she lost her family doctor. She is trying to find a new one. She is running out of her medications. I will go ahead and refill these for her.  She is having no problems with her monthly cycles.  She has had no change in bowel or bladder habits.  She has had no cough or shortness of breath.  His been no rashes. She's had no leg swelling. She's had no bleeding or bruising.  His been no fever. She's had no infections since we last saw her.   Overall, her performance status is ECOG 0. Medications:  Current outpatient prescriptions:  .  aspirin-acetaminophen-caffeine (EXCEDRIN MIGRAINE) 250-250-65 MG per tablet, Takes 1 tablet every 2 days as needed for headache., Disp: , Rfl:  .  betamethasone dipropionate (DIPROLENE) 0.05 % cream, Apply topically 2 (two) times daily. (Patient not taking: Reported on 11/09/2015), Disp: 30 g, Rfl: 1 .  escitalopram (LEXAPRO) 10 MG tablet, 1/2 tab by mouth once daily x 1 week, then increase to a full tab on week two (Patient not taking: Reported on 11/09/2015), Disp: 30 tablet, Rfl: 0 .  Fish Oil-Cholecalciferol (FISH OIL + D3 PO), Take by mouth every morning. Reported on 11/09/2015, Disp: , Rfl:  .  Ginkgo Biloba 100 MG CAPS, Take by mouth every morning. Reported on 11/09/2015, Disp: , Rfl:  .  Levonorgestrel-Ethinyl Estradiol (AMETHIA,CAMRESE) 0.15-0.03 &0.01 MG tablet, Take 1 tablet by mouth daily. (Patient not taking: Reported on 11/09/2015), Disp: 1 Package, Rfl: 3 .  metoprolol  tartrate (LOPRESSOR) 25 MG tablet, TAKE 1/2 TABLET BY MOUTH TWICE DAILY (Patient not taking: Reported on 11/09/2015), Disp: 90 tablet, Rfl: 1 .  SUMAtriptan (IMITREX) 50 MG tablet, TAKE 1 TABLET BY MOUTH AT THE START OF MIGRAINE. MAY REPEAT IN 2 HOURS AS NEEDED. MAXIMUM OF 2 TABLETS PER 24 HOURS (Patient not taking: Reported on 11/09/2015), Disp: 30 tablet, Rfl: 1 .  Vitamin D, Cholecalciferol, 1000 UNITS TABS, Take by mouth every morning. Reported on 11/09/2015, Disp: , Rfl:   Allergies: No Known Allergies  Past Medical History, Surgical history, Social history, and Family History were reviewed and updated.  Review of Systems: As above  Physical Exam:  height is 5' 5.5" (1.664 m) and weight is 170 lb (77.111 kg). Her oral temperature is 97.5 F (36.4 C). Her blood pressure is 114/78 and her pulse is 83. Her respiration is 18.   Well-developed and well-nourished white female. Vital signs are temperature of 98.2. Pulse 70. Blood pressure 120 or 76. Weight is 163 pounds. Head and neck exam shows no ocular or oral lesions. There are no palpable cervical or supraclavicular lymph nodes. Lungs are clear. Cardiac exam regular rate and rhythm with no murmurs rubs or bruits. Abdomen is soft. Has good bowel sounds. No fluid wave. There is no palpable liver or spleen tip. Back exam no tenderness over the spine ribs or hips. Extremities shows no clubbing cyanosis or edema. I would say that she has a  positive straight leg raise on the right side. She has no weakness in her legs. Skin exam no rashes, ecchymoses or petechia. Neurological exam is nonfocal.  Lab Results  Component Value Date   WBC 7.0 11/09/2015   HGB 14.6 11/09/2015   HCT 42.3 11/09/2015   MCV 87 11/09/2015   PLT 277 11/09/2015     Chemistry      Component Value Date/Time   NA 139 10/14/2014 0851   NA 139 10/15/2013 1010   K 3.8 10/14/2014 0851   K 3.8 10/15/2013 1010   CL 106 10/14/2014 0851   CL 105 10/15/2013 1010   CO2 24 10/14/2014  0851   CO2 27 10/15/2013 1010   BUN 11 10/14/2014 0851   BUN 11 10/15/2013 1010   CREATININE 0.68 10/14/2014 0851   CREATININE 0.7 10/15/2013 1010      Component Value Date/Time   CALCIUM 8.9 10/14/2014 0851   CALCIUM 8.8 10/15/2013 1010   ALKPHOS 44 10/14/2014 0851   ALKPHOS 37 10/15/2013 1010   AST 21 10/14/2014 0851   AST 23 10/15/2013 1010   ALT 25 10/14/2014 0851   ALT 31 10/15/2013 1010   BILITOT 0.6 10/14/2014 0851   BILITOT 0.50 10/15/2013 1010         Impression and Plan: Ms. Gatz is 30 year old female with a history of childhood lymphoblastic leukemia. This obviously is cured. I don't see any complications so far from her treatments.  She would be at risk for second malignancies.  With her labs and physical exam, I don't see any issues with a second malignancy.  Her vitamin D level is okay at 13. She will continue take vitamin D.  Her labs all look okay area and I would get her smear under the microscope. I do not see anything that looked suspicious.  I'm sure she will find a new family doctor.  We will go and plan to get her back for one more year.  Volanda Napoleon, MD 7/6/20173:02 PM

## 2015-11-10 LAB — VITAMIN D 25 HYDROXY (VIT D DEFICIENCY, FRACTURES): Vitamin D, 25-Hydroxy: 35.9 ng/mL (ref 30.0–100.0)

## 2016-03-18 ENCOUNTER — Emergency Department (HOSPITAL_BASED_OUTPATIENT_CLINIC_OR_DEPARTMENT_OTHER)
Admission: EM | Admit: 2016-03-18 | Discharge: 2016-03-18 | Disposition: A | Discharge status: Home / Self Care | Payer: Medicaid Other | Attending: Emergency Medicine | Admitting: Emergency Medicine

## 2016-03-18 ENCOUNTER — Encounter (HOSPITAL_BASED_OUTPATIENT_CLINIC_OR_DEPARTMENT_OTHER): Payer: Self-pay | Admitting: *Deleted

## 2016-03-18 ENCOUNTER — Emergency Department (HOSPITAL_BASED_OUTPATIENT_CLINIC_OR_DEPARTMENT_OTHER): Payer: Medicaid Other

## 2016-03-18 DIAGNOSIS — R102 Pelvic and perineal pain: Secondary | ICD-10-CM | POA: Diagnosis not present

## 2016-03-18 DIAGNOSIS — O219 Vomiting of pregnancy, unspecified: Secondary | ICD-10-CM | POA: Diagnosis not present

## 2016-03-18 DIAGNOSIS — Z7982 Long term (current) use of aspirin: Secondary | ICD-10-CM | POA: Insufficient documentation

## 2016-03-18 DIAGNOSIS — Z3A01 Less than 8 weeks gestation of pregnancy: Secondary | ICD-10-CM | POA: Diagnosis not present

## 2016-03-18 DIAGNOSIS — O26891 Other specified pregnancy related conditions, first trimester: Secondary | ICD-10-CM | POA: Diagnosis present

## 2016-03-18 DIAGNOSIS — Z21 Asymptomatic human immunodeficiency virus [HIV] infection status: Secondary | ICD-10-CM | POA: Insufficient documentation

## 2016-03-18 DIAGNOSIS — Z3490 Encounter for supervision of normal pregnancy, unspecified, unspecified trimester: Secondary | ICD-10-CM

## 2016-03-18 LAB — URINALYSIS, ROUTINE W REFLEX MICROSCOPIC
Bilirubin Urine: NEGATIVE
GLUCOSE, UA: NEGATIVE mg/dL
Hgb urine dipstick: NEGATIVE
Ketones, ur: NEGATIVE mg/dL
LEUKOCYTES UA: NEGATIVE
Nitrite: NEGATIVE
PROTEIN: NEGATIVE mg/dL
SPECIFIC GRAVITY, URINE: 1.008 (ref 1.005–1.030)
pH: 6.5 (ref 5.0–8.0)

## 2016-03-18 LAB — WET PREP, GENITAL
CLUE CELLS WET PREP: NONE SEEN
SPERM: NONE SEEN
TRICH WET PREP: NONE SEEN

## 2016-03-18 LAB — PREGNANCY, URINE: Preg Test, Ur: POSITIVE — AB

## 2016-03-18 LAB — ABO/RH: ABO/RH(D): O POS

## 2016-03-18 LAB — HCG, QUANTITATIVE, PREGNANCY: hCG, Beta Chain, Quant, S: 106952 m[IU]/mL — ABNORMAL HIGH (ref <5)

## 2016-03-18 NOTE — ED Provider Notes (Signed)
[place] DEPT [facility] Provider Note   CSN: [id number] Arrival date & time: 03/18/16  1530   By signing my name below, I, [name], attest that this documentation has been prepared under the direction and in the presence of Malvin Johns, MD . Electronically Signed: [name], ED Scribe. 03/18/2016. 3:56 PM.   History   Chief Complaint Chief Complaint  Patient presents with  . Pelvic Pain    The history is provided by the patient. No language interpreter was used.  HPI Comments:  TEAGHEN Adkins is a [id number] 30 y.o. female who presents to the Emergency Department complaining of constant, recently worsening pelvic pain x ~2.5 weeks. Pt states that the pain has been worsening today. Pt complains of associated nausea and vomiting. Pt states that the pain radiates to her right flank and right lower back. Pt took a home pregnancy test 3 days ago that was positive. LNMP was 02/08/16. No alleviating factors noted. Pt denies vaginal discharge/bledding, fever, urinary symptoms.   She is G3P2.  Past Medical History:  Diagnosis Date  . History of HPV infection   . 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 Anxiety and depression 02/01/2015  . Nevus 10/27/2014  . Arthralgia 10/14/2014  . Eczema 10/14/2014  . GERD (gastroesophageal reflux disease) 10/14/2014  . Acute bacterial sinusitis 03/23/2014  . Lumbar back pain with radiculopathy affecting right lower extremity 01/14/2014  . Routine general medical examination at a health care facility 10/30/2012  . 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 Migraine 09/15/2012  . 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 IUD REMOVAL  2008   "IUD penetrated the abdomen and had to be surgically removed"  . [place] or 1999?    OB History    Gravida Para Term Preterm AB Living   3 2            SAB TAB Ectopic Multiple Live Births                   Home Medications    Prior to Admission medications   Medication Sig Start Date End Date Taking? Authorizing Provider  aspirin-acetaminophen-caffeine (EXCEDRIN MIGRAINE) [phone] MG per tablet Takes 1 tablet every 2 days as needed for headache.    Historical Provider, MD  betamethasone dipropionate (DIPROLENE) 0.05 % cream Apply topically 2 (two) times daily. Patient not taking: Reported on 11/09/2015 10/14/14   Debbrah Alar, NP  escitalopram (LEXAPRO) 10 MG tablet 1/2 tab by mouth once daily x 1 week, then increase to a full tab on week two 11/09/15   Volanda Napoleon, MD  Fish Oil-Cholecalciferol (FISH OIL + D3 PO) Take by mouth every morning. Reported on 11/09/2015    Historical Provider, MD  Ginkgo Biloba 100 MG CAPS Take by mouth every morning. Reported on 11/09/2015    Historical Provider, MD  metoprolol tartrate (LOPRESSOR) 25 MG tablet TAKE 1/2 TABLET BY MOUTH TWICE DAILY 11/09/15   Volanda Napoleon, MD  SUMAtriptan (IMITREX) 50 MG tablet TAKE 1 TABLET BY MOUTH AT THE START OF MIGRAINE. MAY REPEAT IN 2 HOURS AS NEEDED. MAXIMUM OF 2 TABLETS PER 24 HOURS Patient not taking: Reported on 11/09/2015 10/14/14   Debbrah Alar, NP  Vitamin D, Cholecalciferol, 1000 UNITS TABS Take by mouth every morning. Reported on  11/09/2015    Historical Provider, MD    Family History Family History  Problem Relation Age of Onset  . COPD Mother   . Hypertension Father   . Hyperlipidemia Father   . Cancer Paternal Uncle     lung  . Diabetes Maternal Grandmother   . Cancer Paternal Grandfather     brain  . Heart disease Neg Hx   . Stroke Neg Hx     Social History Social History  Substance Use Topics  . Smoking status: Never Smoker  . Smokeless tobacco: Never Used     Comment: NEVER SMOKED  . Alcohol use 1.2 oz/week    2 Standard drinks or equivalent per week     Allergies   Patient has no known allergies.   Review of  Systems Review of Systems  Constitutional: Negative for chills, diaphoresis, fatigue and fever.  HENT: Negative for congestion, rhinorrhea and sneezing.   Eyes: Negative.   Respiratory: Negative for cough, chest tightness and shortness of breath.   Cardiovascular: Negative for chest pain and leg swelling.  Gastrointestinal: Positive for nausea and vomiting. Negative for abdominal pain, blood in stool and diarrhea.  Genitourinary: Positive for pelvic pain. Negative for difficulty urinating, dysuria, flank pain, frequency, hematuria, vaginal bleeding and vaginal discharge.  Musculoskeletal: Negative for arthralgias and back pain.  Skin: Negative for rash.  Neurological: Negative for dizziness, speech difficulty, weakness, numbness and headaches.  All other systems reviewed and are negative.    Physical Exam Updated Vital Signs BP 116/78 (BP Location: Right Arm)   Pulse 87   Resp 18   Ht 5\' 7"  (1.702 m)   Wt 165 lb (74.8 kg)   LMP 02/08/2016 (Within Days)   SpO2 98%   BMI 25.84 kg/m   Physical Exam  Constitutional: She is oriented to person, place, and time. She appears well-developed and well-nourished.  HENT:  Head: Normocephalic and atraumatic.  Eyes: Pupils are equal, round, and reactive to light.  Neck: Normal range of motion. Neck supple.  Cardiovascular: Normal rate, regular rhythm and normal heart sounds.   Pulmonary/Chest: Effort normal and breath sounds normal. No respiratory distress. She has no wheezes. She has no rales. She exhibits no tenderness.  Abdominal: Soft. Bowel sounds are normal. There is tenderness. There is no rebound and no guarding.  Tenderness to suprapubic region and RLQ   Genitourinary: Vagina normal and uterus normal. Cervix exhibits no motion tenderness. Right adnexum displays tenderness. Left adnexum displays no tenderness. No bleeding in the vagina. No vaginal discharge found.  Musculoskeletal: Normal range of motion. She exhibits no edema.    Lymphadenopathy:    She has no cervical adenopathy.  Neurological: She is alert and oriented to person, place, and time.  Skin: Skin is warm and dry. No rash noted.  Psychiatric: She has a normal mood and affect.     ED Treatments / Results  DIAGNOSTIC STUDIES:  Oxygen Saturation is 100% on RA, normal by my interpretation.    COORDINATION OF CARE:  3:56 PM Discussed treatment plan with pt at bedside and pt agreed to plan.   Labs (all labs ordered are listed, but only abnormal results are displayed) Labs Reviewed  WET PREP, GENITAL - Abnormal; Notable for the following:       Result Value   Yeast Wet Prep HPF POC PRESENT (*)    WBC, Wet Prep HPF POC MANY (*)    All other components within normal limits  PREGNANCY, URINE - Abnormal; Notable  for the following:    Preg Test, Ur POSITIVE (*)    All other components within normal limits  HCG, QUANTITATIVE, PREGNANCY - Abnormal; Notable for the following:    hCG, Beta Neomia Dear 106,952 (*)    All other components within normal limits  URINALYSIS, ROUTINE W REFLEX MICROSCOPIC (NOT AT [facility])  RPR  HIV ANTIBODY (ROUTINE TESTING)  ABO/RH  GC/CHLAMYDIA PROBE AMP ([place]) NOT AT [facility]    EKG  EKG Interpretation None       Radiology US Ob Comp Less 14 Wks  Result Date: 03/18/2016 CLINICAL DATA:  Pelvic pain for 2-3 weeks. Positive pregnancy test. Gravida 3 para 2. LMP was10/09/2015. Gestational age by LMP is5 weeks 4 days. EDC by LMP is07/04/2017. EXAM: OBSTETRIC <14 WK [place] AND TRANSVAGINAL OB US TECHNIQUE: Both transabdominal and transvaginal ultrasound examinations were performed for complete evaluation of the gestation as well as the maternal uterus, adnexal regions, and pelvic cul-de-sac. Transvaginal technique was performed to assess early pregnancy. COMPARISON:  None. FINDINGS: Intrauterine gestational sac: Present Yolk sac:  Present Embryo:  Present Cardiac Activity: Present Heart Rate: 152  bpm CRL:  13  mm   7 w    4 d                  [place] EDC: 10/31/2016 Subchorionic hemorrhage:  No subchorionic hemorrhage identified. Maternal uterus/adnexae: The ovaries have a normal appearance. Left corpus luteum cyst is present. No free pelvic fluid. IMPRESSION: 1. Single living intrauterine embryo. 2. By today's exam EDC is 10/31/2016, significantly different from clinical dating. Electronically Signed   By: Nolon Nations M.D.   On: 03/18/2016 16:48   US Ob Transvaginal  Result Date: 03/18/2016 CLINICAL DATA:  Pelvic pain for 2-3 weeks. Positive pregnancy test. Gravida 3 para 2. LMP was10/09/2015. Gestational age by LMP is5 weeks 4 days. EDC by LMP is07/04/2017. EXAM: OBSTETRIC <14 WK [place] AND TRANSVAGINAL OB US TECHNIQUE: Both transabdominal and transvaginal ultrasound examinations were performed for complete evaluation of the gestation as well as the maternal uterus, adnexal regions, and pelvic cul-de-sac. Transvaginal technique was performed to assess early pregnancy. COMPARISON:  None. FINDINGS: Intrauterine gestational sac: Present Yolk sac:  Present Embryo:  Present Cardiac Activity: Present Heart Rate: 152  bpm CRL:  13  mm   7 w   4 d                  [place] EDC: 10/31/2016 Subchorionic hemorrhage:  No subchorionic hemorrhage identified. Maternal uterus/adnexae: The ovaries have a normal appearance. Left corpus luteum cyst is present. No free pelvic fluid. IMPRESSION: 1. Single living intrauterine embryo. 2. By today's exam EDC is 10/31/2016, significantly different from clinical dating. Electronically Signed   By: Nolon Nations M.D.   On: 03/18/2016 16:48    Procedures Procedures (including critical care time)  Medications Ordered in ED Medications - No data to display   Initial Impression / Assessment and Plan / ED Course  I have reviewed the triage vital signs and the nursing notes.  Pertinent labs & imaging results that were available during my care of the patient were reviewed by me and considered in my  medical decision making (see chart for details).  Clinical Course     Patient's ultrasound shows an intrauterine pregnancy. She has no abnormal bleeding. Her abdominal exam is non-concerning. She was discharged home in good condition. She was encouraged to establish care with an OB/GYN. I discussed prenatal vitamins however patient  states that she's not keeping the pregnancy. She was encouraged to follow-up with the women's outpatient center as needed for the [facility] for any worsening pain or bleeding.  Final Clinical Impressions(s) / ED Diagnoses   Final diagnoses:  Pelvic pain in female  Intrauterine pregnancy    New Prescriptions Discharge Medication List as of 03/18/2016  6:27 PM     I personally performed the services described in this documentation, which was scribed in my presence.  The recorded information has been reviewed and considered.     Malvin Johns, MD 03/18/16 [phone]

## 2016-03-18 NOTE — ED Notes (Signed)
Patient transported to Ultrasound 

## 2016-03-18 NOTE — ED Notes (Signed)
Patient found out she is pregnant today.  Reports having right pelvic pain.  Denies discharge or urinary symptoms.

## 2016-03-18 NOTE — ED Triage Notes (Signed)
Pt reports pelvic area pain with right sided low back pain x several weeks, worse today, denies any fevers.

## 2016-03-18 NOTE — ED Notes (Signed)
Patient left without notifying HCP.  Gown found on patients bed.

## 2016-03-19 LAB — GC/CHLAMYDIA PROBE AMP (~~LOC~~) NOT AT ARMC
Chlamydia: NEGATIVE
Neisseria Gonorrhea: NEGATIVE

## 2016-03-19 LAB — HIV ANTIBODY (ROUTINE TESTING W REFLEX): HIV SCREEN 4TH GENERATION: NONREACTIVE

## 2016-03-19 LAB — RPR: RPR: NONREACTIVE

## 2016-04-05 ENCOUNTER — Other Ambulatory Visit: Payer: Self-pay | Admitting: Hematology & Oncology

## 2016-11-07 ENCOUNTER — Emergency Department (HOSPITAL_BASED_OUTPATIENT_CLINIC_OR_DEPARTMENT_OTHER)
Admission: EM | Admit: 2016-11-07 | Discharge: 2016-11-07 | Disposition: A | Discharge status: Home / Self Care | Payer: Medicaid Other | Attending: Emergency Medicine | Admitting: Emergency Medicine

## 2016-11-07 ENCOUNTER — Emergency Department (HOSPITAL_BASED_OUTPATIENT_CLINIC_OR_DEPARTMENT_OTHER): Payer: Medicaid Other

## 2016-11-07 ENCOUNTER — Encounter (HOSPITAL_BASED_OUTPATIENT_CLINIC_OR_DEPARTMENT_OTHER): Payer: Self-pay | Admitting: Emergency Medicine

## 2016-11-07 DIAGNOSIS — Z79899 Other long term (current) drug therapy: Secondary | ICD-10-CM | POA: Insufficient documentation

## 2016-11-07 DIAGNOSIS — W01198A Fall on same level from slipping, tripping and stumbling with subsequent striking against other object, initial encounter: Secondary | ICD-10-CM | POA: Diagnosis not present

## 2016-11-07 DIAGNOSIS — Z856 Personal history of leukemia: Secondary | ICD-10-CM | POA: Insufficient documentation

## 2016-11-07 DIAGNOSIS — R0789 Other chest pain: Secondary | ICD-10-CM

## 2016-11-07 DIAGNOSIS — S20212A Contusion of left front wall of thorax, initial encounter: Secondary | ICD-10-CM | POA: Insufficient documentation

## 2016-11-07 DIAGNOSIS — S20309A Unspecified superficial injuries of unspecified front wall of thorax, initial encounter: Secondary | ICD-10-CM | POA: Diagnosis present

## 2016-11-07 DIAGNOSIS — Y939 Activity, unspecified: Secondary | ICD-10-CM | POA: Diagnosis not present

## 2016-11-07 DIAGNOSIS — Y999 Unspecified external cause status: Secondary | ICD-10-CM | POA: Diagnosis not present

## 2016-11-07 DIAGNOSIS — Y929 Unspecified place or not applicable: Secondary | ICD-10-CM | POA: Insufficient documentation

## 2016-11-07 MED ORDER — IBUPROFEN 600 MG PO TABS: 600.0000 mg | ORAL_TABLET | Freq: Four times a day (QID) | ORAL | 0 refills | Status: AC | PRN

## 2016-11-07 MED ORDER — ACETAMINOPHEN ER 650 MG PO TBCR: 650.0000 mg | EXTENDED_RELEASE_TABLET | Freq: Four times a day (QID) | ORAL | 0 refills | Status: AC

## 2016-11-07 MED ORDER — CYCLOBENZAPRINE HCL 10 MG PO TABS: 10.0000 mg | ORAL_TABLET | Freq: Two times a day (BID) | ORAL | 0 refills | Status: AC | PRN

## 2016-11-07 MED ORDER — HYDROCODONE-ACETAMINOPHEN 5-325 MG PO TABS
1.0000 | ORAL_TABLET | Freq: Once | ORAL | Status: AC
  Administered 2016-11-07: 1 via ORAL
  Filled 2016-11-07: qty 1

## 2016-11-07 MED ORDER — NAPROXEN 250 MG PO TABS
500.0000 mg | ORAL_TABLET | Freq: Once | ORAL | Status: AC
  Administered 2016-11-07: 500 mg via ORAL
  Filled 2016-11-07: qty 2

## 2016-11-07 MED ORDER — TRAMADOL HCL 50 MG PO TABS: 100.0000 mg | ORAL_TABLET | Freq: Two times a day (BID) | ORAL | 0 refills | Status: AC | PRN

## 2016-11-07 NOTE — Progress Notes (Signed)
Pt instructed on incentive spirometer and frequency.   Pt performed 10 times with good pt effort reaching 800-1052ml.  RT will continue to monitor.

## 2016-11-07 NOTE — Discharge Instructions (Signed)
The xrays of the ribs show no fracture or collapsed lung. Please take the medicine provided for symptom relief. Use the spirometer as directed every few minutes. Alternate between ice and heat every 2-4 hours for 10 minutes.

## 2016-11-07 NOTE — ED Notes (Signed)
Patient transported to X-ray 

## 2016-11-07 NOTE — ED Triage Notes (Signed)
Pt states she hit ribs on stairs Saturday and again while on vacation Tuesday

## 2016-11-07 NOTE — ED Provider Notes (Signed)
[facility] Provider Note   CSN: [id number] Arrival date & time: 11/07/16  1457     History   Chief Complaint Chief Complaint  Patient presents with  . Chest Pain    HPI Rita Adkins is a 31 y.o. female.  HPI Pt comes in with cc of fall and rib pain. Pt had a fall on 5 days ago and then again 2 days ago - each time she fell onto a hard object and onto her R side. Pt has been having pain with movement and breathing on the R side of her chest since hen. Pt is taking 800 mg ibuprofen with minimal relief. Pt has some dib. Pt not on blood thinners.  Past Medical History:  Diagnosis Date  . History of HPV infection   . 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 Anxiety and depression 02/01/2015  . Nevus 10/27/2014  . Arthralgia 10/14/2014  . Eczema 10/14/2014  . GERD (gastroesophageal reflux disease) 10/14/2014  . Acute bacterial sinusitis 03/23/2014  . Lumbar back pain with radiculopathy affecting right lower extremity 01/14/2014  . Routine general medical examination at a health care facility 10/30/2012  . 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 Migraine 09/15/2012  . 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 IUD REMOVAL  2008   "IUD penetrated the abdomen and had to be surgically removed"  . [place] or 1999?    OB History    Gravida Para Term Preterm AB Living   3 2           SAB TAB Ectopic Multiple Live Births                   Home Medications    Prior to Admission medications   Medication Sig Start Date End Date Taking? Authorizing Provider  acetaminophen (TYLENOL 8 HOUR) 650 MG CR tablet Take 1 tablet (650 mg total) by mouth every 6 (six) hours. 11/07/16   Varney Biles, MD  aspirin-acetaminophen-caffeine (EXCEDRIN MIGRAINE) [phone] MG per tablet Takes 1 tablet every 2 days as needed for  headache.    [provider]  betamethasone dipropionate (DIPROLENE) 0.05 % cream Apply topically 2 (two) times daily. Patient not taking: Reported on 11/09/2015 10/14/14   Debbrah Alar, NP  cyclobenzaprine (FLEXERIL) 10 MG tablet Take 1 tablet (10 mg total) by mouth 2 (two) times daily as needed for muscle spasms. 11/07/16   Varney Biles, MD  escitalopram (LEXAPRO) 10 MG tablet 1/2 tab by mouth once daily x 1 week, then increase to a full tab on week two 11/09/15   Volanda Napoleon, MD  Fish Oil-Cholecalciferol (FISH OIL + D3 PO) Take by mouth every morning. Reported on 11/09/2015    [provider]  Ginkgo Biloba 100 MG CAPS Take by mouth every morning. Reported on 11/09/2015    [provider]  ibuprofen (ADVIL,MOTRIN) 600 MG tablet Take 1 tablet (600 mg total) by mouth every 6 (six) hours as needed. 11/07/16   Varney Biles, MD  metoprolol tartrate (LOPRESSOR) 25 MG tablet TAKE 1/2 TABLET BY MOUTH TWICE DAILY 11/09/15   Volanda Napoleon, MD  SUMAtriptan (IMITREX) 50 MG tablet TAKE 1 TABLET BY MOUTH AT THE START OF MIGRAINE. MAY REPEAT IN 2 HOURS AS NEEDED. MAX OF 2 TABS/24HRS 04/05/16  Volanda Napoleon, MD  traMADol (ULTRAM) 50 MG tablet Take 2 tablets (100 mg total) by mouth every 12 (twelve) hours as needed for severe pain. 11/07/16   Varney Biles, MD  Vitamin D, Cholecalciferol, 1000 UNITS TABS Take by mouth every morning. Reported on 11/09/2015    [provider]    Family History Family History  Problem Relation Age of Onset  . COPD Mother   . Hypertension Father   . Hyperlipidemia Father   . Cancer Paternal Uncle        lung  . Diabetes Maternal Grandmother   . Cancer Paternal Grandfather        brain  . Heart disease Neg Hx   . Stroke Neg Hx     Social History Social History  Substance Use Topics  . Smoking status: Never Smoker  . Smokeless tobacco: Never Used     Comment: NEVER SMOKED  . Alcohol use 1.2 oz/week    2 Standard drinks or  equivalent per week     Allergies   Patient has no known allergies.   Review of Systems Review of Systems  Respiratory: Positive for shortness of breath.   Cardiovascular: Positive for chest pain.  Musculoskeletal: Positive for myalgias.  Hematological: Does not bruise/bleed easily.     Physical Exam Updated Vital Signs BP (!) 135/91   Pulse 100   Temp 99 F (37.2 C) (Oral)   Resp 18   Wt 74.8 kg (165 lb)   LMP 09/07/2016   SpO2 100%   Breastfeeding? Unknown   BMI 25.84 kg/m   Physical Exam  Constitutional: She is oriented to person, place, and time. She appears well-developed and well-nourished.  HENT:  Head: Normocephalic and atraumatic.  Eyes: EOM are normal. Pupils are equal, round, and reactive to light.  Neck: Neck supple.  Cardiovascular: Normal rate, regular rhythm and normal heart sounds.   Pulmonary/Chest: Effort normal. No respiratory distress. She has no wheezes. She exhibits tenderness.  No bruising, L lateral and anterior chest wall tenderness  Abdominal: Soft. She exhibits no distension. There is no tenderness. There is no rebound and no guarding.  Neurological: She is alert and oriented to person, place, and time.  Skin: Skin is warm and dry. No rash noted.  Nursing note and vitals reviewed.    ED Treatments / Results  Labs (all labs ordered are listed, but only abnormal results are displayed) Labs Reviewed - No data to display  EKG  EKG Interpretation None       Radiology Dg Ribs Unilateral W/chest Right  Result Date: 11/07/2016 CLINICAL DATA:  Two episodes of falling over the past 2 days. The patient reports right lower anterolateral ribcage pain. EXAM: RIGHT RIBS AND CHEST - 3+ VIEW COMPARISON:  Chest x-ray of July 19, 2014 FINDINGS: The lungs are well-expanded. There is no focal infiltrate. There is no pneumothorax or pulmonary contusion. The heart and pulmonary vascularity are normal. The mediastinum is normal in width. There is  gentle curvature convex toward the right centered in the mid thoracic spine. This is stable. A metallic BB has been placed over the lower lateral right ribcage. No acute displaced or nondisplaced rib fracture is observed. Subtle contour deformity of the tip of the right eleventh rib is chronic. IMPRESSION: No acute right rib fracture is observed. There is no acute cardiopulmonary abnormality. Electronically Signed   By: David  Martinique M.D.   On: 11/07/2016 15:37    Procedures Procedures (including critical care time)  Medications Ordered in ED Medications  naproxen (NAPROSYN) tablet 500 mg (500 mg Oral Given 11/07/16 1532)  HYDROcodone-acetaminophen (NORCO/VICODIN) 5-325 MG per tablet 1 tablet (1 tablet Oral Given 11/07/16 1532)     Initial Impression / Assessment and Plan / ED Course  I have reviewed the triage vital signs and the nursing notes.  Pertinent labs & imaging results that were available during my care of the patient were reviewed by me and considered in my medical decision making (see chart for details).     Pt comes in with cc of fall. Pt has R sided rib pain and she has tenderness to palpation, no bruising. Pt's falls were 3 days ago and 5 days ago. The lungs are clear. CXR ordered.  Pt's abd exam is benign.  Final Clinical Impressions(s) / ED Diagnoses   Final diagnoses:  Rib contusion, left, initial encounter  Chest wall pain    New Prescriptions New Prescriptions   ACETAMINOPHEN (TYLENOL 8 HOUR) 650 MG CR TABLET    Take 1 tablet (650 mg total) by mouth every 6 (six) hours.   CYCLOBENZAPRINE (FLEXERIL) 10 MG TABLET    Take 1 tablet (10 mg total) by mouth 2 (two) times daily as needed for muscle spasms.   IBUPROFEN (ADVIL,MOTRIN) 600 MG TABLET    Take 1 tablet (600 mg total) by mouth every 6 (six) hours as needed.   TRAMADOL (ULTRAM) 50 MG TABLET    Take 2 tablets (100 mg total) by mouth every 12 (twelve) hours as needed for severe pain.     Varney Biles,  MD 11/07/16 [phone]

## 2016-11-07 NOTE — ED Notes (Signed)
Incentive spirometer: 1250 x2

## 2016-11-07 NOTE — ED Notes (Signed)
[place] and hit her right chest on another rock.  She complaint that it hurts her chest when she breath.

## 2016-11-08 ENCOUNTER — Other Ambulatory Visit: Payer: BLUE CROSS/BLUE SHIELD

## 2016-11-08 ENCOUNTER — Ambulatory Visit: Payer: BLUE CROSS/BLUE SHIELD | Admitting: Family

## 2016-11-22 ENCOUNTER — Other Ambulatory Visit: Payer: Self-pay | Admitting: Hematology & Oncology

## 2016-11-22 DIAGNOSIS — C9101 Acute lymphoblastic leukemia, in remission: Secondary | ICD-10-CM

## 2016-11-22 DIAGNOSIS — G43009 Migraine without aura, not intractable, without status migrainosus: Secondary | ICD-10-CM

## 2016-11-22 DIAGNOSIS — Z0000 Encounter for general adult medical examination without abnormal findings: Secondary | ICD-10-CM

## 2016-11-22 DIAGNOSIS — F418 Other specified anxiety disorders: Secondary | ICD-10-CM

## 2017-03-04 ENCOUNTER — Other Ambulatory Visit: Payer: Self-pay | Admitting: Hematology & Oncology

## 2017-03-04 DIAGNOSIS — C9101 Acute lymphoblastic leukemia, in remission: Secondary | ICD-10-CM

## 2017-03-04 DIAGNOSIS — G43009 Migraine without aura, not intractable, without status migrainosus: Secondary | ICD-10-CM

## 2017-03-04 DIAGNOSIS — F418 Other specified anxiety disorders: Secondary | ICD-10-CM

## 2017-03-04 DIAGNOSIS — Z0000 Encounter for general adult medical examination without abnormal findings: Secondary | ICD-10-CM

## 2017-06-26 ENCOUNTER — Other Ambulatory Visit: Payer: Self-pay | Admitting: Hematology & Oncology

## 2017-06-26 DIAGNOSIS — C9101 Acute lymphoblastic leukemia, in remission: Secondary | ICD-10-CM

## 2017-06-26 DIAGNOSIS — Z0000 Encounter for general adult medical examination without abnormal findings: Secondary | ICD-10-CM

## 2017-06-26 DIAGNOSIS — F418 Other specified anxiety disorders: Secondary | ICD-10-CM

## 2017-06-26 DIAGNOSIS — G43009 Migraine without aura, not intractable, without status migrainosus: Secondary | ICD-10-CM

## 2017-09-21 ENCOUNTER — Other Ambulatory Visit: Payer: Self-pay | Admitting: Hematology & Oncology

## 2017-09-21 DIAGNOSIS — G43009 Migraine without aura, not intractable, without status migrainosus: Secondary | ICD-10-CM

## 2017-09-21 DIAGNOSIS — F418 Other specified anxiety disorders: Secondary | ICD-10-CM

## 2017-09-21 DIAGNOSIS — Z0000 Encounter for general adult medical examination without abnormal findings: Secondary | ICD-10-CM

## 2017-09-21 DIAGNOSIS — C9101 Acute lymphoblastic leukemia, in remission: Secondary | ICD-10-CM

## 2017-12-27 ENCOUNTER — Other Ambulatory Visit: Payer: Self-pay | Admitting: Family

## 2017-12-27 DIAGNOSIS — G43009 Migraine without aura, not intractable, without status migrainosus: Secondary | ICD-10-CM

## 2017-12-27 DIAGNOSIS — C9101 Acute lymphoblastic leukemia, in remission: Secondary | ICD-10-CM

## 2017-12-27 DIAGNOSIS — F418 Other specified anxiety disorders: Secondary | ICD-10-CM

## 2017-12-27 DIAGNOSIS — Z0000 Encounter for general adult medical examination without abnormal findings: Secondary | ICD-10-CM

## 2017-12-29 ENCOUNTER — Other Ambulatory Visit: Payer: Self-pay | Admitting: Hematology & Oncology

## 2017-12-29 DIAGNOSIS — F418 Other specified anxiety disorders: Secondary | ICD-10-CM

## 2017-12-29 DIAGNOSIS — G43009 Migraine without aura, not intractable, without status migrainosus: Secondary | ICD-10-CM

## 2017-12-29 DIAGNOSIS — C9101 Acute lymphoblastic leukemia, in remission: Secondary | ICD-10-CM

## 2017-12-29 DIAGNOSIS — Z0000 Encounter for general adult medical examination without abnormal findings: Secondary | ICD-10-CM

## 2018-03-15 IMAGING — US US OB TRANSVAGINAL
1 series · 14 of 28 positions shown · non-contrast
Comparison: None.

CLINICAL DATA: Pelvic pain for 2-3 weeks. Positive pregnancy test.
Gravida 3 para 2.

LMP was02/08/2016.
Gestational age by LMP is5 weeks 4 days.
EDC by LMP is11/14/2016.
EXAM:
OBSTETRIC <14 WK US AND TRANSVAGINAL OB US
TECHNIQUE: Both transabdominal and transvaginal ultrasound examinations were
performed for complete evaluation of the gestation as well as the
maternal uterus, adnexal regions, and pelvic cul-de-sac.
Transvaginal technique was performed to assess early pregnancy.

[Series 1: us ob transvaginal · 14 of 30 slices shown]
[im 2/30]
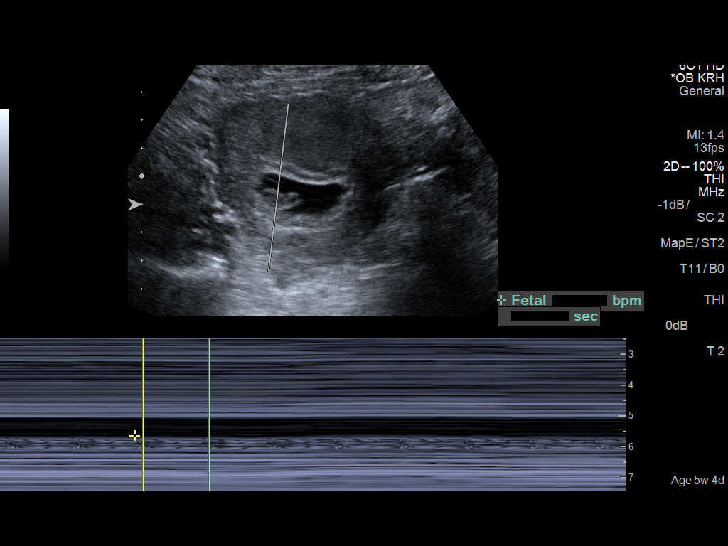
[im 4/30]
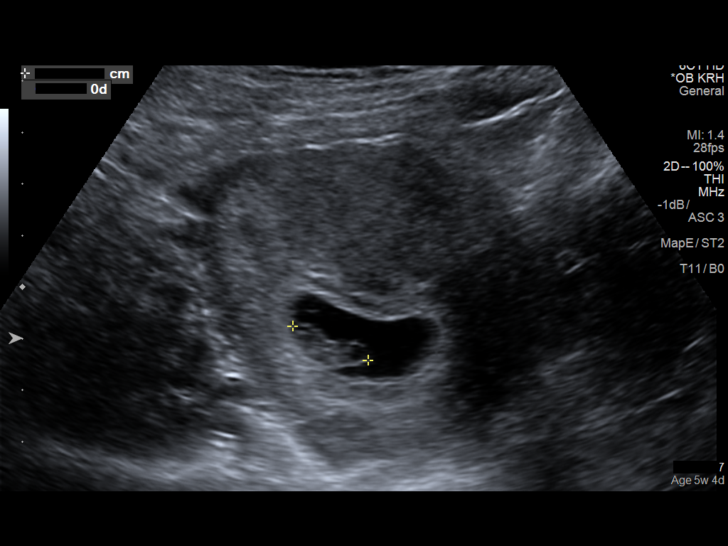
[im 6/30]
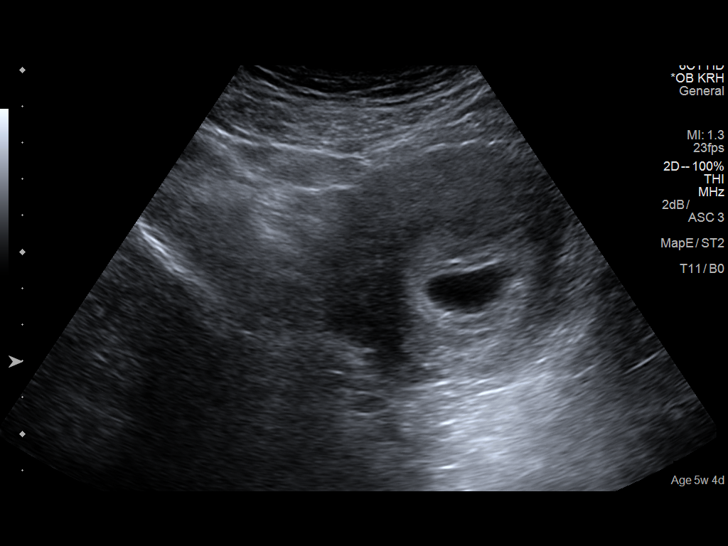
[im 8/30]
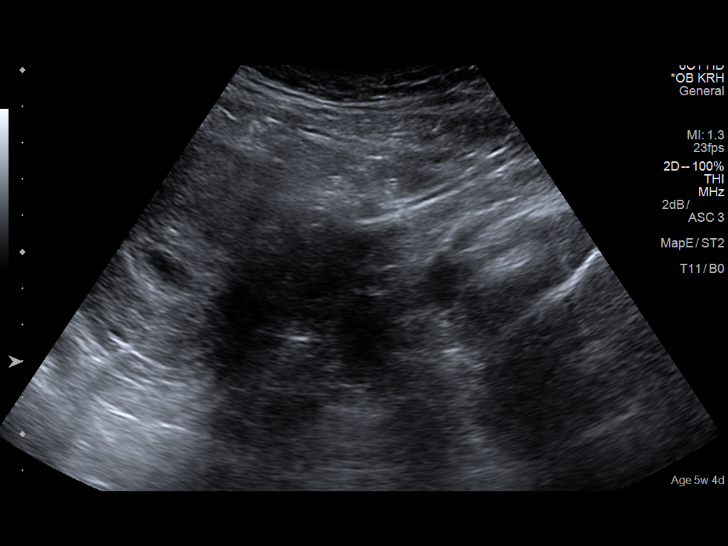
[im 10/30]
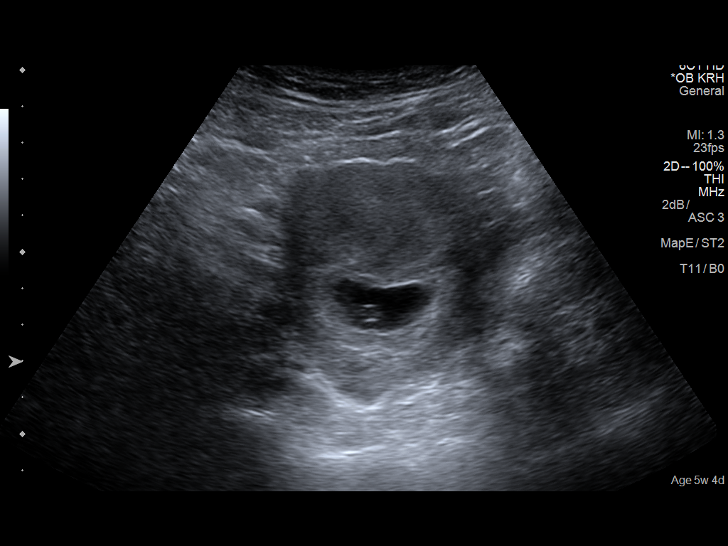
[im 12/30]
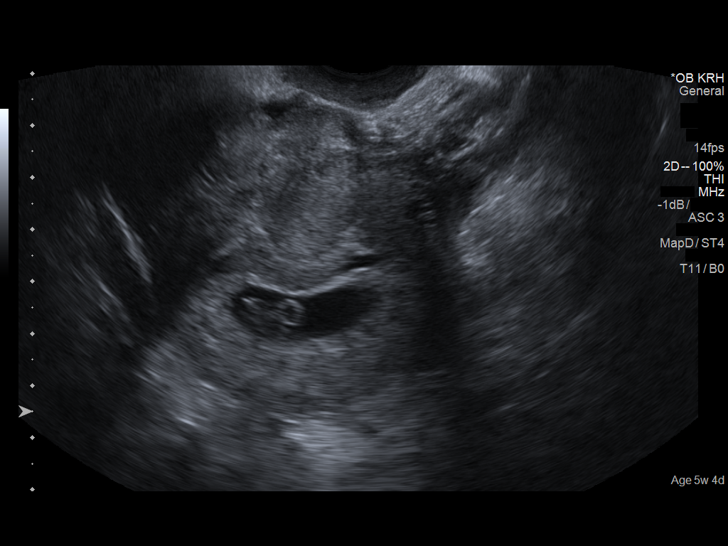
[im 14/30]
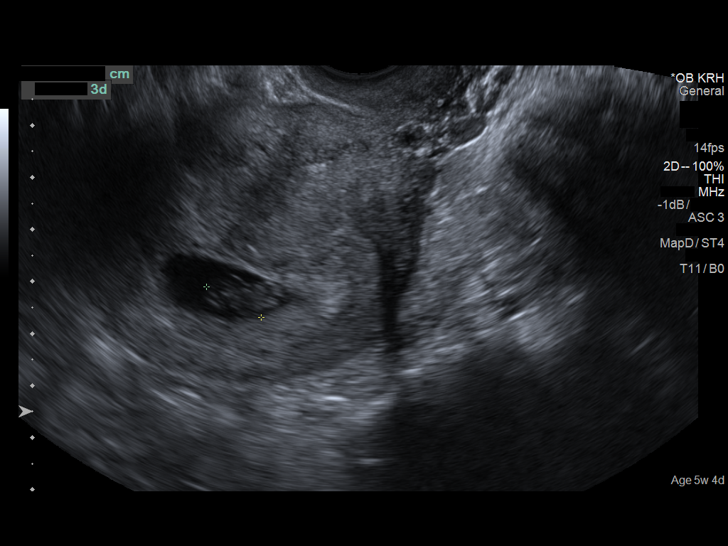
[im 17/30]
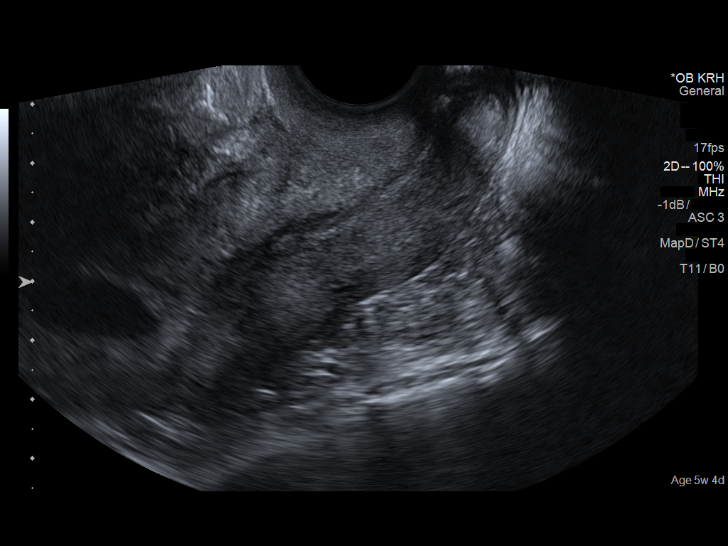
[im 19/30]
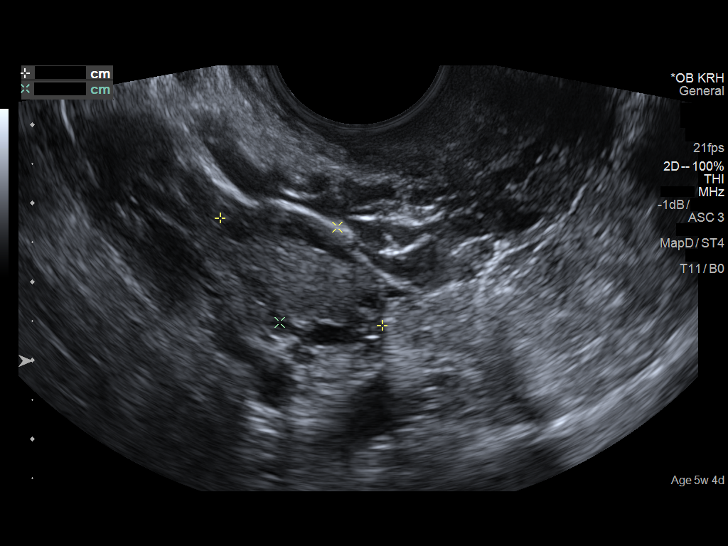
[im 21/30]
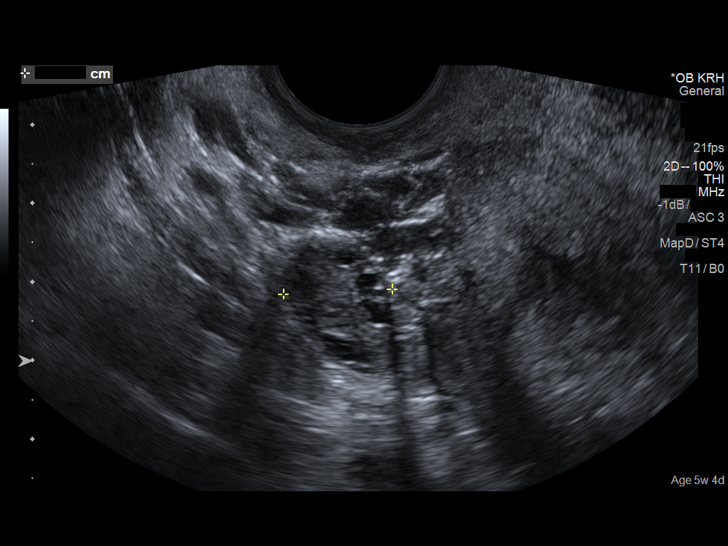
[im 23/30]
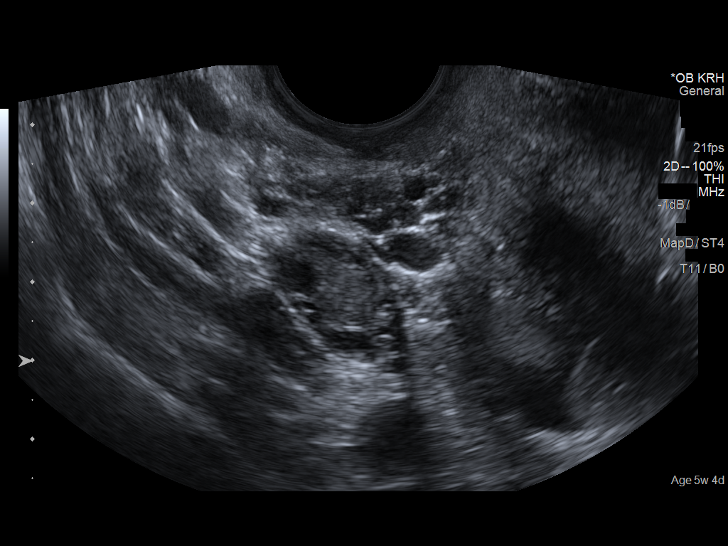
[im 25/30]
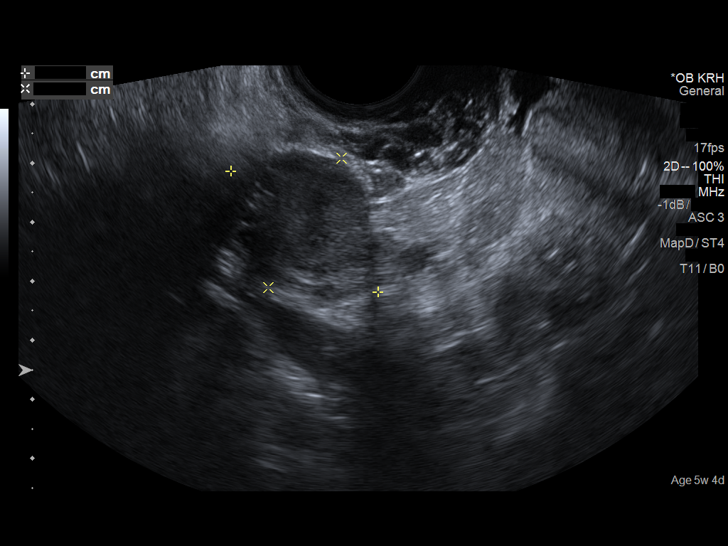
[im 27/30]
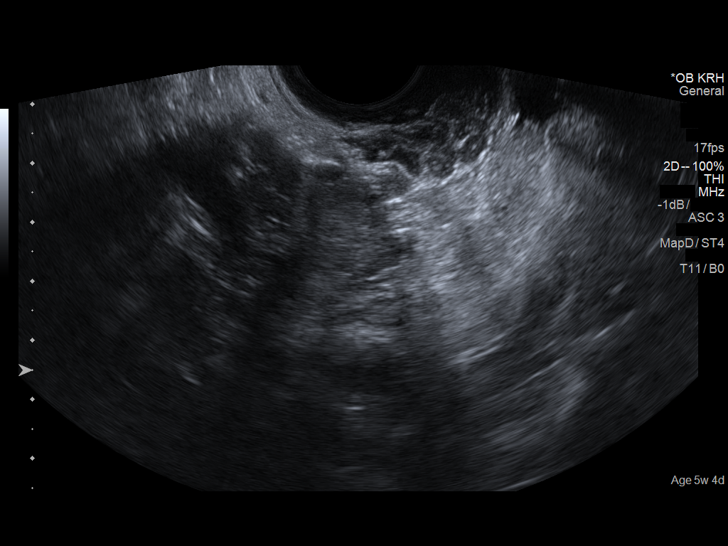
[im 30/30]
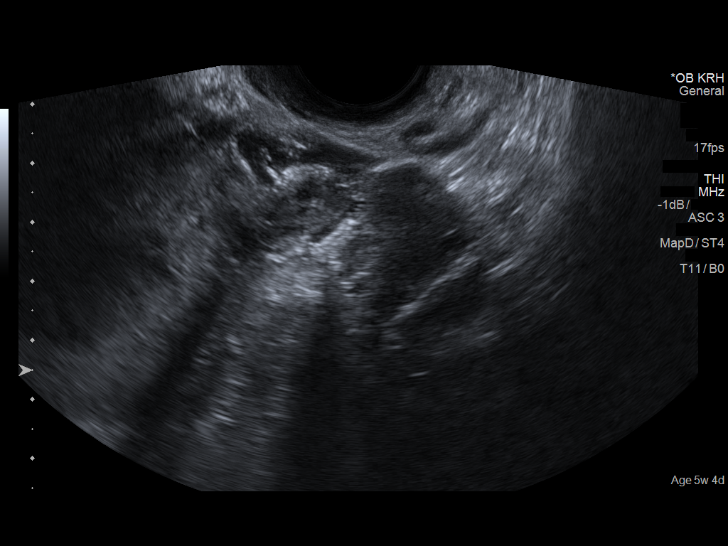

[14 of 28 positions shown; findings below may reference images not displayed]

FINDINGS: Intrauterine gestational sac: Present

Yolk sac:  Present

Embryo:  Present

Cardiac Activity: Present

Heart Rate: 152  bpm

CRL:  13  mm   7 w   4 d                  US EDC: 10/31/2016

Subchorionic hemorrhage:  No subchorionic hemorrhage identified.

Maternal uterus/adnexae: The ovaries have a normal appearance. Left
corpus luteum cyst is present. No free pelvic fluid.
IMPRESSION: 1. Single living intrauterine embryo.
2. By today's exam EDC is 10/31/2016, significantly different from
clinical dating.

## 2018-03-21 ENCOUNTER — Other Ambulatory Visit: Payer: Self-pay | Admitting: Hematology & Oncology

## 2018-03-21 DIAGNOSIS — F418 Other specified anxiety disorders: Secondary | ICD-10-CM

## 2018-03-21 DIAGNOSIS — G43009 Migraine without aura, not intractable, without status migrainosus: Secondary | ICD-10-CM

## 2018-03-21 DIAGNOSIS — C9101 Acute lymphoblastic leukemia, in remission: Secondary | ICD-10-CM

## 2018-03-21 DIAGNOSIS — Z0000 Encounter for general adult medical examination without abnormal findings: Secondary | ICD-10-CM

## 2018-12-05 IMAGING — CR DG RIBS W/ CHEST 3+V*R*
3 series · 3 of 3 positions shown · non-contrast
Comparison: Chest x-ray of July 19, 2014

CLINICAL DATA: Two episodes of falling over the past 2 days. The
patient reports right lower anterolateral ribcage pain.

EXAM:
RIGHT RIBS AND CHEST - 3+ VIEW

[w chest pa]
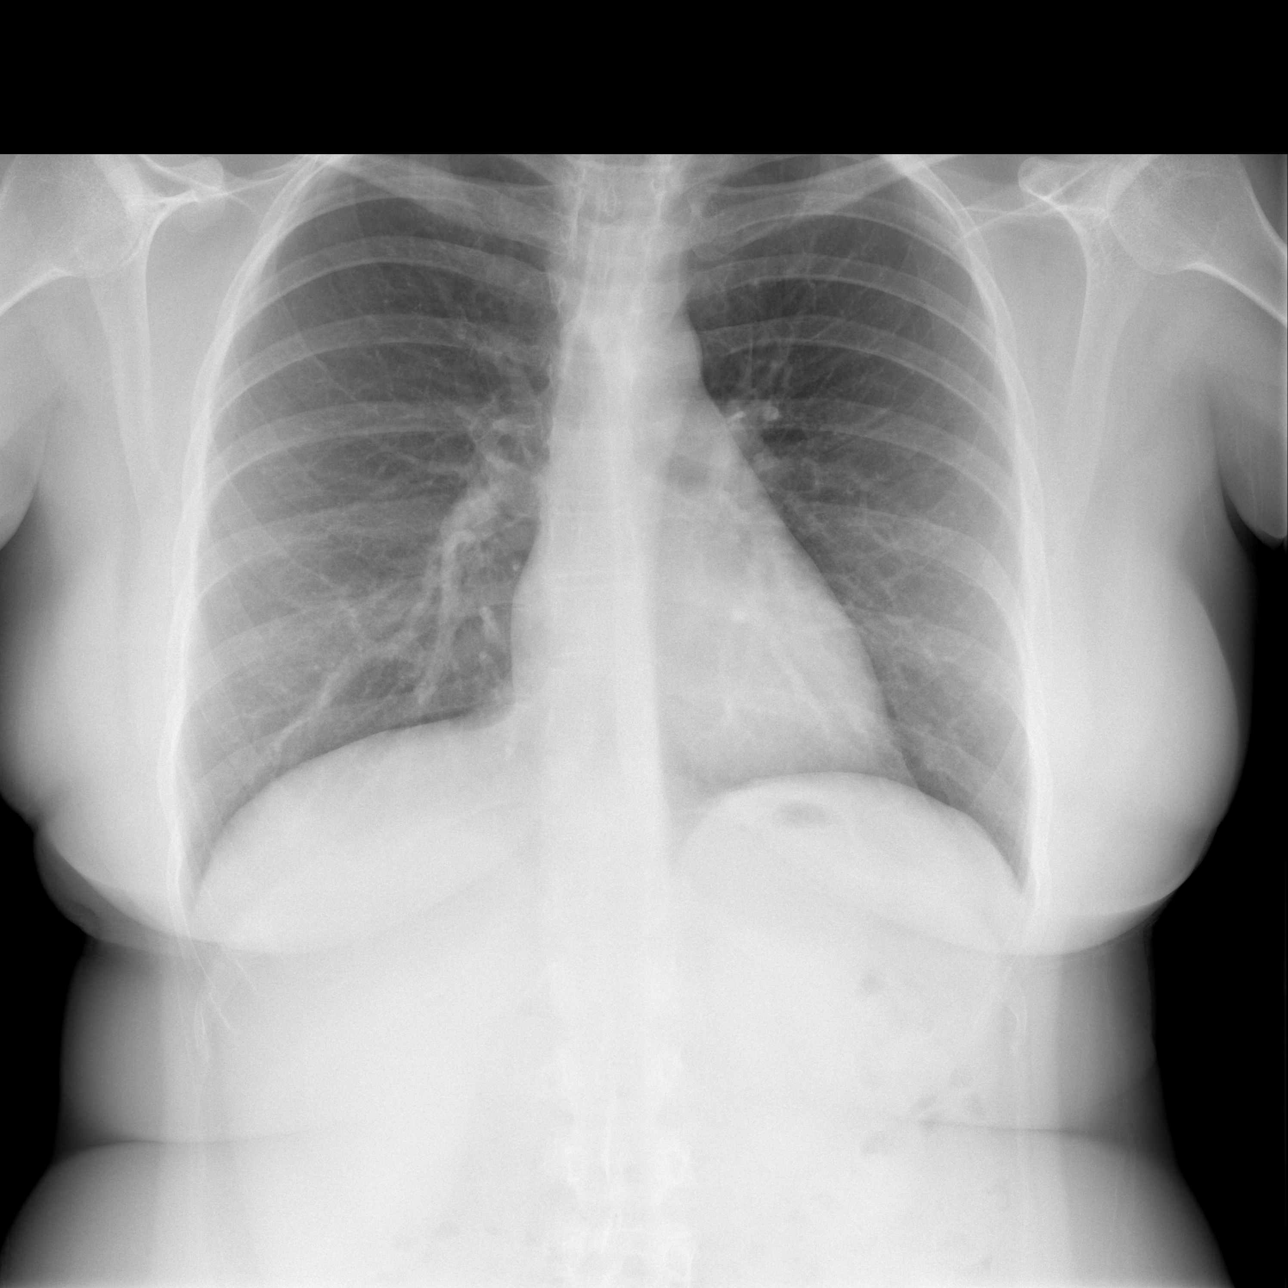

[w ribs ap/pa lower right *]
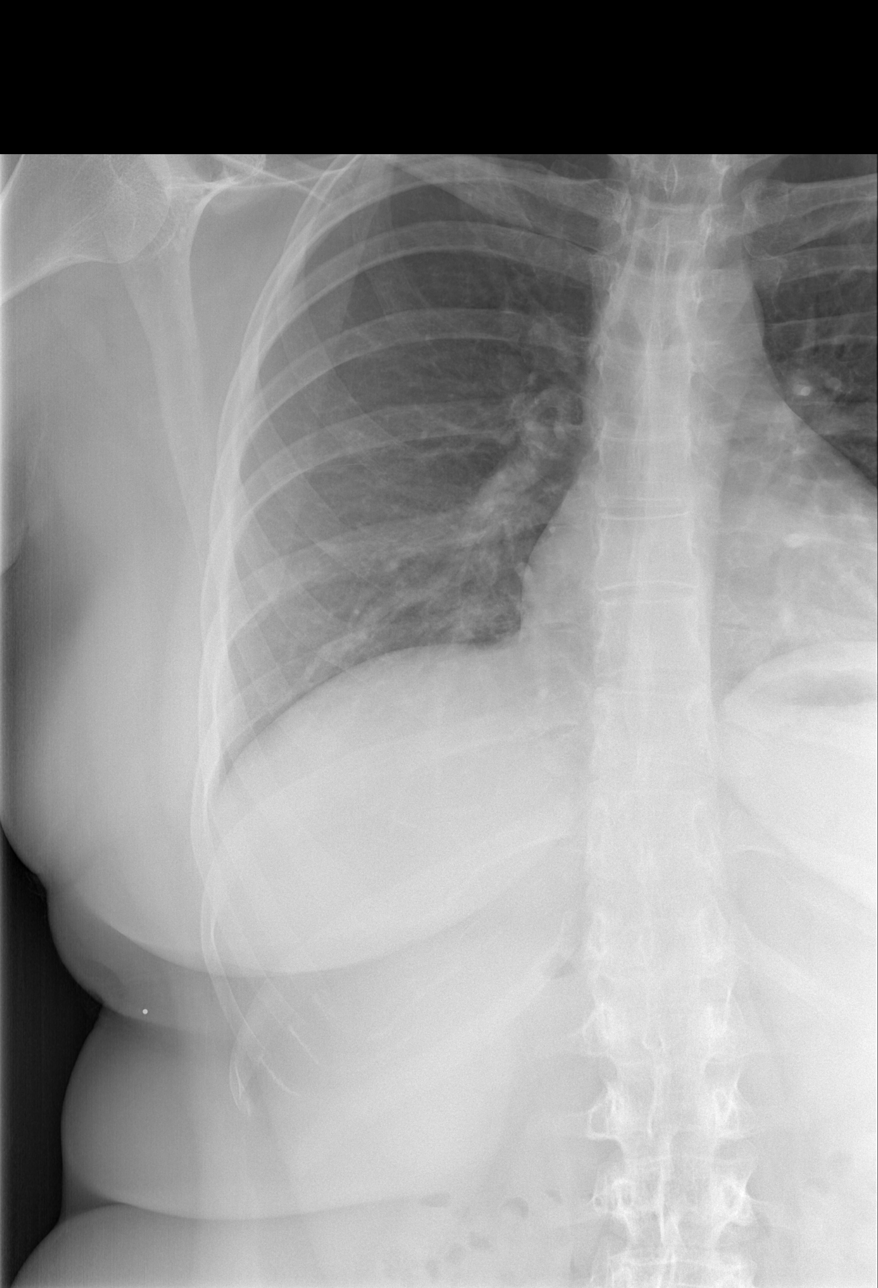

[w ribs oblique right *]
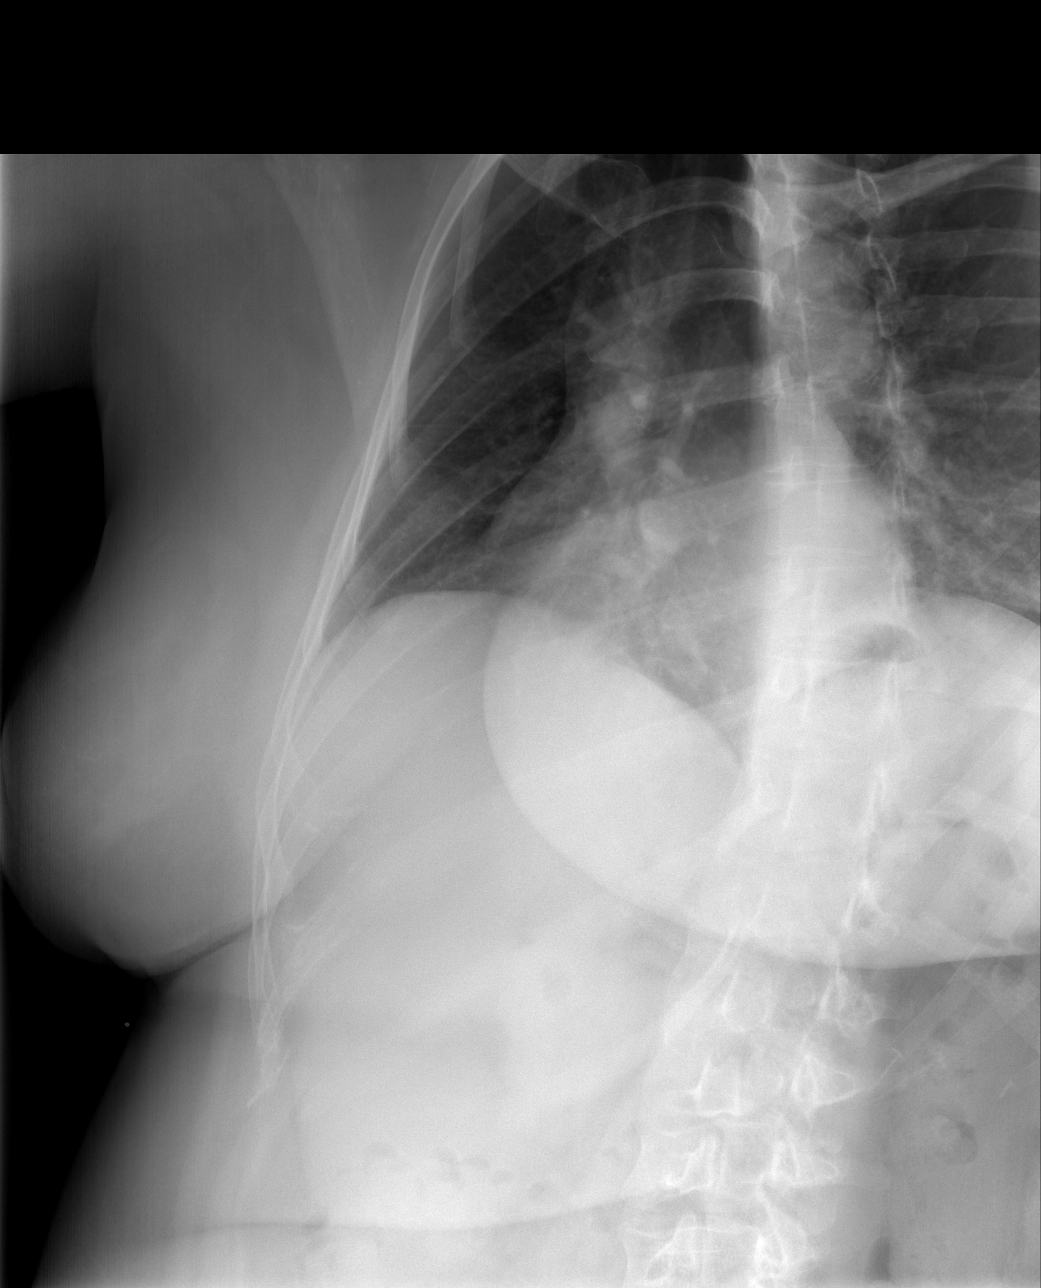

[3 of 3 positions shown; findings below may reference images not displayed]

FINDINGS: The lungs are well-expanded. There is no focal infiltrate. There is
no pneumothorax or pulmonary contusion. The heart and pulmonary
vascularity are normal. The mediastinum is normal in width. There is
gentle curvature convex toward the right centered in the mid
thoracic spine. This is stable.

A metallic BB has been placed over the lower lateral right ribcage.
No acute displaced or nondisplaced rib fracture is observed. Subtle
contour deformity of the tip of the right eleventh rib is chronic.
IMPRESSION: No acute right rib fracture is observed. There is no acute
cardiopulmonary abnormality.

## 2020-02-14 ENCOUNTER — Other Ambulatory Visit: Payer: Self-pay

## 2020-02-14 DIAGNOSIS — Z20822 Contact with and (suspected) exposure to covid-19: Secondary | ICD-10-CM

## 2020-02-15 LAB — NOVEL CORONAVIRUS, NAA: SARS-CoV-2, NAA: NOT DETECTED

## 2020-02-15 LAB — SARS-COV-2, NAA 2 DAY TAT
